# Patient Record
Sex: Male | Born: 1955 | Race: White | Hispanic: No | Marital: Married | Smoking: Never smoker
Health system: Southern US, Community
[De-identification: ages and names within clinical notes are randomized; demographics above are authoritative.]

## PROBLEM LIST (undated history)

## (undated) DIAGNOSIS — C61 Malignant neoplasm of prostate: Secondary | ICD-10-CM

## (undated) DIAGNOSIS — E7800 Pure hypercholesterolemia, unspecified: Secondary | ICD-10-CM

## (undated) DIAGNOSIS — T7840XA Allergy, unspecified, initial encounter: Secondary | ICD-10-CM

## (undated) DIAGNOSIS — K635 Polyp of colon: Secondary | ICD-10-CM

## (undated) HISTORY — DX: Polyp of colon: K63.5

## (undated) HISTORY — DX: Allergy, unspecified, initial encounter: T78.40XA

## (undated) HISTORY — DX: Malignant neoplasm of prostate: C61

---

## 2005-07-08 ENCOUNTER — Ambulatory Visit (HOSPITAL_COMMUNITY): Admission: RE | Admit: 2005-07-08 | Discharge: 2005-07-08 | Payer: Self-pay | Admitting: Infectious Diseases

## 2005-09-08 ENCOUNTER — Emergency Department (HOSPITAL_COMMUNITY): Admission: EM | Admit: 2005-09-08 | Discharge: 2005-09-08 | Payer: Self-pay | Admitting: Emergency Medicine

## 2006-12-03 ENCOUNTER — Emergency Department (HOSPITAL_COMMUNITY): Admission: EM | Admit: 2006-12-03 | Discharge: 2006-12-03 | Payer: Self-pay | Admitting: Emergency Medicine

## 2009-12-07 ENCOUNTER — Encounter (INDEPENDENT_AMBULATORY_CARE_PROVIDER_SITE_OTHER): Payer: Self-pay | Admitting: *Deleted

## 2010-10-21 ENCOUNTER — Emergency Department (HOSPITAL_BASED_OUTPATIENT_CLINIC_OR_DEPARTMENT_OTHER)
Admission: EM | Admit: 2010-10-21 | Discharge: 2010-10-21 | Payer: Self-pay | Source: Home / Self Care | Admitting: Emergency Medicine

## 2010-10-22 LAB — CSF CELL COUNT WITH DIFFERENTIAL
RBC Count, CSF: 0 /mm3
RBC Count, CSF: 0 /mm3
Tube #: 1
WBC, CSF: 0 /mm3 (ref 0–5)
WBC, CSF: 0 /mm3 (ref 0–5)

## 2010-10-22 LAB — BASIC METABOLIC PANEL
CO2: 27 mEq/L (ref 19–32)
Glucose, Bld: 87 mg/dL (ref 70–99)
Potassium: 4.1 mEq/L (ref 3.5–5.1)
Sodium: 144 mEq/L (ref 135–145)

## 2010-10-22 LAB — CBC
HCT: 41.5 % (ref 39.0–52.0)
Hemoglobin: 14.3 g/dL (ref 13.0–17.0)
MCV: 87.9 fL (ref 78.0–100.0)
RDW: 12.7 % (ref 11.5–15.5)
WBC: 6 10*3/uL (ref 4.0–10.5)

## 2010-10-22 LAB — GRAM STAIN

## 2010-10-22 LAB — DIFFERENTIAL
Basophils Absolute: 0.1 10*3/uL (ref 0.0–0.1)
Basophils Relative: 1 % (ref 0–1)
Lymphocytes Relative: 35 % (ref 12–46)
Lymphs Abs: 2.1 10*3/uL (ref 0.7–4.0)
Monocytes Absolute: 0.4 10*3/uL (ref 0.1–1.0)
Neutro Abs: 3.2 10*3/uL (ref 1.7–7.7)
Neutrophils Relative %: 53 % (ref 43–77)

## 2010-10-22 LAB — GLUCOSE, CSF: Glucose, CSF: 51 mg/dL (ref 43–76)

## 2010-10-24 ENCOUNTER — Encounter
Admission: RE | Admit: 2010-10-24 | Discharge: 2010-10-24 | Payer: Self-pay | Source: Home / Self Care | Attending: Family Medicine | Admitting: Family Medicine

## 2010-10-24 LAB — CSF CULTURE W GRAM STAIN
Culture: NO GROWTH
Gram Stain: NONE SEEN

## 2010-10-29 NOTE — Letter (Signed)
Summary: Referral - not able to see patient  [facility]  [address] [place], [place] [address]   Phone: [phone]  Fax: [phone]    December 07, 2009         Dr. Raechel Ache [address] [place], [place]  [address]            Karl [name] DOB:  10/17/1955 MRN:   [id number]    Dear Dr. Para March:  Thank you for your kind referral of the above patient.  We have attempted to schedule the recommended procedure, colonoscopy, but have not been able to schedule because:  _x_ The patient was not available by phone and/or has not returned our calls.  ___ The patient declined to schedule the procedure at this time.  We appreciate the referral and hope that we will have the opportunity to treat this patient in the future.    Sincerely,    [facility] [phone]

## 2012-07-04 ENCOUNTER — Emergency Department (HOSPITAL_BASED_OUTPATIENT_CLINIC_OR_DEPARTMENT_OTHER)
Admission: EM | Admit: 2012-07-04 | Discharge: 2012-07-04 | Disposition: A | Discharge status: Home / Self Care | Payer: Federal, State, Local not specified - PPO | Attending: Emergency Medicine | Admitting: Emergency Medicine

## 2012-07-04 ENCOUNTER — Encounter (HOSPITAL_BASED_OUTPATIENT_CLINIC_OR_DEPARTMENT_OTHER): Payer: Self-pay | Admitting: *Deleted

## 2012-07-04 DIAGNOSIS — M549 Dorsalgia, unspecified: Secondary | ICD-10-CM | POA: Insufficient documentation

## 2012-07-04 DIAGNOSIS — E7800 Pure hypercholesterolemia, unspecified: Secondary | ICD-10-CM | POA: Insufficient documentation

## 2012-07-04 HISTORY — DX: Pure hypercholesterolemia, unspecified: E78.00

## 2012-07-04 MED ORDER — CYCLOBENZAPRINE HCL 10 MG PO TABS: 10.0000 mg | ORAL_TABLET | Freq: Two times a day (BID) | ORAL | Status: DC | PRN

## 2012-07-04 MED ORDER — HYDROCODONE-ACETAMINOPHEN 5-325 MG PO TABS: 2.0000 | ORAL_TABLET | ORAL | Status: DC | PRN

## 2012-07-04 NOTE — ED Provider Notes (Signed)
Medical screening examination/treatment/procedure(s) were performed by non-physician practitioner and as supervising physician I was immediately available for consultation/collaboration.   Dazani Norby, MD 07/04/12 2331 

## 2012-07-04 NOTE — ED Notes (Signed)
Pt presents to ED today with  low back pain for the last month.  Pt reports no obvious injury or trauma.  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  Pt reports was working on deck and pain increased.

## 2012-07-04 NOTE — ED Notes (Signed)
Pt states he has had some low back pain for several months. Yesterday was working on a deck and ? Aggravated the issue.

## 2012-07-04 NOTE — ED Notes (Signed)
Called into pts room as pt very dissatisfied with service received today.  Attempted to explain to pt that unit census and acuity is high.  Pt states "well someone should have told me"  Advised pt that I did tell him early on that we were busy and I took responsibility for not coming back to speak with him and attempted to explain.  Pt was not happy with reasons but stated understanding.  Pt also extremely upset about treatment from PA.  Advised pt that I could not speak to treatment since I was not present but would relay dissatisfaction to DD.  Pt left dept calmed and improved.

## 2012-07-04 NOTE — ED Provider Notes (Signed)
History     CSN: [id number]  Arrival date & time 07/04/12  1409   First MD Initiated Contact with Patient 07/04/12 1616      Chief Complaint  Patient presents with  . Back Pain    (Consider location/radiation/quality/duration/timing/severity/associated sxs/prior treatment) Patient is a 56 y.o. male presenting with back pain. The history is provided by the patient.  Back Pain  This is a recurrent problem. The current episode started yesterday. The problem has not changed since onset.Associated with: yard work. The pain is present in the lumbar spine. The quality of the pain is described as aching. The pain is at a severity of 7/10. The pain is severe. The symptoms are aggravated by bending. He has tried nothing for the symptoms.  Pt reports he has pain in his back after working in yard. Pt reports he just wants a muscle relaxer and pain medication. Pt reports he will see his MD tomorrow Susann Givens)  Past Medical History  Diagnosis Date  . Hypercholesteremia     History reviewed. No pertinent past surgical history.  History reviewed. No pertinent family history.  History  Substance Use Topics  . Smoking status: Never Smoker   . Smokeless tobacco: Not on file  . Alcohol Use: No      Review of Systems  Musculoskeletal: Positive for back pain.  All other systems reviewed and are negative.    Allergies  Penicillins  Home Medications   Current Outpatient Rx  Name Route Sig Dispense Refill  . ASPIRIN 81 MG PO TABS Oral Take 81 mg by mouth daily.    Marland Kitchen EZETIMIBE-SIMVASTATIN 10-20 MG PO TABS Oral Take 1 tablet by mouth at bedtime.    . CYCLOBENZAPRINE HCL 10 MG PO TABS Oral Take 1 tablet (10 mg total) by mouth 2 (two) times daily as needed for muscle spasms. 20 tablet 0  . HYDROCODONE-ACETAMINOPHEN 5-325 MG PO TABS Oral Take 2 tablets by mouth every 4 (four) hours as needed for pain. 10 tablet 0    BP 146/74  Pulse 66  Temp 97.5 F (36.4 C) (Oral)  Resp 20  Ht 5\' 8"   (1.727 m)  Wt 150 lb (68.04 kg)  BMI 22.81 kg/m2  SpO2 100%  Physical Exam  Vitals reviewed. Constitutional: He appears well-developed and well-nourished.  HENT:  Head: Normocephalic.  Neck: Normal range of motion.  Cardiovascular: Normal rate and normal heart sounds.   Pulmonary/Chest: Effort normal.  Musculoskeletal: He exhibits tenderness.  Neurological: He is alert.  Skin: Skin is warm.    ED Course  Procedures (including critical care time)  Labs Reviewed - No data to display No results found.   1. Back pain       MDM  Pt very angry about waiting to be seen.   i apologized for delay and advised pt that this emergency dept is very busy and that staff has had unusually high acuity.        Lonia Skinner [place], [place] 07/04/12 1648  Lonia Skinner [place], [place] 07/04/12 [phone]

## 2013-08-09 ENCOUNTER — Other Ambulatory Visit: Payer: Self-pay | Admitting: Gastroenterology

## 2013-08-09 DIAGNOSIS — R634 Abnormal weight loss: Secondary | ICD-10-CM

## 2013-08-09 DIAGNOSIS — R1013 Epigastric pain: Secondary | ICD-10-CM

## 2013-08-17 ENCOUNTER — Ambulatory Visit
Admission: RE | Admit: 2013-08-17 | Discharge: 2013-08-17 | Disposition: A | Discharge status: Home / Self Care | Payer: Federal, State, Local not specified - PPO | Source: Ambulatory Visit | Attending: Gastroenterology | Admitting: Gastroenterology

## 2013-08-17 DIAGNOSIS — R1013 Epigastric pain: Secondary | ICD-10-CM

## 2013-08-17 DIAGNOSIS — R634 Abnormal weight loss: Secondary | ICD-10-CM

## 2013-08-17 MED ORDER — IOHEXOL 300 MG/ML  SOLN
100.0000 mL | Freq: Once | INTRAMUSCULAR | Status: AC | PRN
  Administered 2013-08-17: 100 mL via INTRAVENOUS

## 2014-06-02 ENCOUNTER — Encounter: Payer: Self-pay | Admitting: Cardiovascular Disease

## 2014-06-02 ENCOUNTER — Ambulatory Visit (INDEPENDENT_AMBULATORY_CARE_PROVIDER_SITE_OTHER): Payer: Federal, State, Local not specified - PPO | Admitting: Cardiovascular Disease

## 2014-06-02 VITALS — BP 130/86 | HR 73 | Ht 68.0 in | Wt 148.1 lb

## 2014-06-02 DIAGNOSIS — E785 Hyperlipidemia, unspecified: Secondary | ICD-10-CM

## 2014-06-02 DIAGNOSIS — R079 Chest pain, unspecified: Secondary | ICD-10-CM

## 2014-06-02 DIAGNOSIS — I208 Other forms of angina pectoris: Secondary | ICD-10-CM

## 2014-06-02 NOTE — Patient Instructions (Signed)
Your physician has requested that you have an exercise stress myoview. For further information please visit [email or web address]. Please follow instruction sheet, as given.   

## 2014-06-04 ENCOUNTER — Encounter: Payer: Self-pay | Admitting: Cardiovascular Disease

## 2014-06-04 DIAGNOSIS — I2089 Other forms of angina pectoris: Secondary | ICD-10-CM | POA: Insufficient documentation

## 2014-06-04 DIAGNOSIS — I208 Other forms of angina pectoris: Secondary | ICD-10-CM | POA: Insufficient documentation

## 2014-06-04 DIAGNOSIS — E785 Hyperlipidemia, unspecified: Secondary | ICD-10-CM | POA: Insufficient documentation

## 2014-06-04 NOTE — Progress Notes (Signed)
HPI:  Karl [name] is a 58 year-old man presenting for evaluation of chest pain. 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 Approximately one week ago, while at work, he had a 2 hour episode of pressure in the center of his chest. This ultimately resolved on it's own. There was no associated diaphoresis, dyspnea, or lightheadedness. An EKG was checked and showed anterior ST-T changes not previously seen. He has a long history of brief chest pain that occurs with emotional stress or anger, but has not previously had symptoms this long in duration. At the time of his most recent episode, he was fatigued but otherwise under no unusual stressor. He has remained physically active as he exercises regularly and he denies any exertional chest pain or pressure.   He is a lifelong nonsmoker. He has longstanding hyperlipidemia and has been on a statin drug for many years followed by his PCP. No hx of diabetes or HTN. Family hx includes CAD in his father (MI at 57, died of MI at 57). His mother dies at 74 of ovarian CA.  His only other significant medical history includes H Pylori infection diagnosed after he experienced freqent GI upset 2 years ago. He underwent extensive testing including EGD, colonoscopy, and CT scan. He has no hx of surgery.  Outpatient Encounter Prescriptions as of 06/02/2014  Medication Sig  . aspirin 81 MG tablet Take 81 mg by mouth daily.  Marland Kitchen atorvastatin (LIPITOR) 20 MG tablet Take 20 mg by mouth daily.  . Omega-3 Fatty Acids (OMEGA-3 FISH OIL) 1200 MG CAPS Take 1 capsule by mouth daily.  . [DISCONTINUED] clindamycin (CLEOCIN T) 1 % external solution   . [DISCONTINUED] cyclobenzaprine (FLEXERIL) 10 MG tablet Take 1 tablet (10 mg total) by mouth 2 (two) times daily as needed for muscle spasms.  . [DISCONTINUED] doxycycline (VIBRAMYCIN) 100 MG capsule   . [DISCONTINUED] ezetimibe-simvastatin (VYTORIN) 10-20 MG per tablet Take 1 tablet by mouth at bedtime.  .  [DISCONTINUED] HYDROcodone-acetaminophen (NORCO/VICODIN) 5-325 MG per tablet Take 2 tablets by mouth every 4 (four) hours as needed for pain.  . [DISCONTINUED] predniSONE (DELTASONE) 20 MG tablet     Penicillins  Past Medical History  Diagnosis Date  . Hypercholesteremia     History reviewed. No pertinent past surgical history.  History   Social History  . Marital Status: Married    Spouse Name: N/A    Number of Children: N/A  . Years of Education: N/A   Occupational History  . Not on file.   Social History Main Topics  . Smoking status: Never Smoker   . Smokeless tobacco: Not on file  . Alcohol Use: No  . Drug Use: No  . Sexual Activity: Not on file   Other Topics Concern  . Not on file   Social History Narrative  . No narrative on file    Family History  Problem Relation Age of Onset  . Heart attack Mother   . Cancer - Ovarian Father     ROS: General: no fevers/chills/night sweats Eyes: no blurry vision, diplopia, or amaurosis ENT: no sore throat or hearing loss Resp: no cough, wheezing, or hemoptysis CV: no edema or palpitations GI: no abdominal pain, nausea, vomiting, diarrhea, or constipation. Positive for abdominal bloating GU: no dysuria, frequency, or hematuria Skin: no rash Neuro: no headache, numbness, tingling, or weakness of extremities Musculoskeletal: no joint pain or swelling Heme: no bleeding, DVT, or easy bruising Endo: no  polydipsia or polyuria  BP 130/86  Pulse 73  Ht 5\' 8"  (1.727 m)  Wt 148 lb 1.9 oz (67.187 kg)  BMI 22.53 kg/m2  PHYSICAL EXAM: Pt is alert and oriented, WD, WN, in no distress. HEENT: normal Neck: JVP normal. Carotid upstrokes normal without bruits. No thyromegaly. Lungs: equal expansion, clear bilaterally CV: Apex is discrete and nondisplaced, RRR without murmur or gallop Abd: soft, NT, +BS, no bruit, no hepatosplenomegaly Back: no CVA tenderness Ext: no C/C/E        DP/PT pulses intact and = Skin: warm and  dry without rash Neuro: CNII-XII intact             Strength intact = bilaterally  EKG:  NSR 73 bpm, ST-T wave abnormality consider anterior ischemia  ASSESSMENT AND PLAN: Chest pain with abnormal EKG. EKG reviewed and shows symmetric anterior T wave inversion concerning for ischemic heart disease. His recent episode of chest pain was prolonged with typical pressure-like sensation in the substernal region. This scenario clearly warrants stress testing in a 58 year-old male with family hx of CAD and longstanding hyperlipidemia. I have recommended an exercise Myoview study to evaluate for ischemic heart disease. Will determine further evaluation and follow-up depending on stress test findings. Current meds reviewed and appropriate with ASA and a statin drug.  Sherren Mocha MD 06/04/2014 9:05 AM

## 2014-06-07 ENCOUNTER — Ambulatory Visit (HOSPITAL_COMMUNITY): Payer: Federal, State, Local not specified - PPO | Attending: Cardiovascular Disease | Admitting: Radiology

## 2014-06-07 VITALS — BP 169/85 | HR 66 | Ht 68.0 in | Wt 147.0 lb

## 2014-06-07 DIAGNOSIS — R079 Chest pain, unspecified: Secondary | ICD-10-CM | POA: Insufficient documentation

## 2014-06-07 DIAGNOSIS — E785 Hyperlipidemia, unspecified: Secondary | ICD-10-CM | POA: Diagnosis not present

## 2014-06-07 MED ORDER — TECHNETIUM TC 99M SESTAMIBI GENERIC - CARDIOLITE
30.0000 | Freq: Once | INTRAVENOUS | Status: AC | PRN
  Administered 2014-06-07: 30 via INTRAVENOUS

## 2014-06-07 MED ORDER — TECHNETIUM TC 99M SESTAMIBI GENERIC - CARDIOLITE
10.0000 | Freq: Once | INTRAVENOUS | Status: AC | PRN
  Administered 2014-06-07: 10 via INTRAVENOUS

## 2014-06-07 NOTE — Progress Notes (Signed)
[facility] [place], [place] [address] [phone]    Cardiology Nuclear Med Study  Karl [name] is a 58 y.o. male     MRN : [id number]     DOB: 04/19/1956  Procedure Date: 06/07/2014  Nuclear Med Background Indication for Stress Test:  Evaluation for Ischemia and Abnormal EKG History:  No known CAD Cardiac Risk Factors: Lipids  Symptoms:  Chest Pain (last date of chest discomfort was ten days ago)   Nuclear Pre-Procedure Caffeine/Decaff Intake:  None> 12 hrs NPO After: 7:00pm   Lungs:  clear O2 Sat: 98% on room air. IV 0.9% NS with Angio Cath:  22g  IV Site: R Wrist x 1, tolerated well IV Started by:  Irven Baltimore, RN  Chest Size (in):  40 Cup Size: n/a  Height: 5\' 8"  (1.727 m)  Weight:  147 lb (66.679 kg)  BMI:  Body mass index is 22.36 kg/(m^2). Tech Comments:  N/A    Nuclear Med Study 1 or 2 day study: 1 day  Stress Test Type:  Stress  Reading MD: N/A  Order Authorizing Provider:  Sherren Mocha, MD  Resting Radionuclide: Technetium 24m Sestamibi  Resting Radionuclide Dose: 10.9 mCi   Stress Radionuclide:  Technetium 4m Sestamibi  Stress Radionuclide Dose: 33.0 mCi           Stress Protocol Rest HR: 66 Stress HR: 137  Rest BP: 169/85 Stress BP: 217/69  Exercise Time (min): 11:00 METS: 13.1           Dose of Adenosine (mg):  n/a Dose of Lexiscan: n/a mg  Dose of Atropine (mg): n/a Dose of Dobutamine: n/a mcg/kg/min (at max HR)  Stress Test Technologist: Glade Lloyd, BS-ES  Nuclear Technologist:  Annye Rusk, CNMT     Rest Procedure:  Myocardial perfusion imaging was performed at rest 45 minutes following the intravenous administration of Technetium 41m Sestamibi. Rest ECG: NSR - Normal EKG  Stress Procedure:  The patient exercised on the treadmill utilizing the Bruce Protocol for 11:00 minutes. The patient stopped due to fatigue and denied any chest pain.  Technetium 30m Sestamibi was injected at peak exercise  and myocardial perfusion imaging was performed after a brief delay. Stress ECG: Significant ST abnormalities consistent with ischemia. Horizontal 1 mm St depression in V5-V6 and in the inferior leads  QPS Raw Data Images:  Normal; no motion artifact; normal heart/lung ratio. Stress Images:  Normal homogeneous uptake in all areas of the myocardium. Rest Images:  Normal homogeneous uptake in all areas of the myocardium. Subtraction (SDS):  No evidence of ischemia. Transient Ischemic Dilatation (Normal <1.22):  1.06 Lung/Heart Ratio (Normal <0.45):  0.29  Quantitative Gated Spect Images QGS EDV:  105 ml QGS ESV:  44 ml  Impression Exercise Capacity:  Good exercise capacity. BP Response:  Hypertensive blood pressure response. Clinical Symptoms:  No significant symptoms noted. ECG Impression:  Significant ST abnormalities consistent with ischemia. Comparison with Prior Nuclear Study: No previous nuclear study performed  Overall Impression:  Normal stress nuclear study. Suspect "false positive" ECG response.  LV Ejection Fraction: 58%.  LV Wall Motion:  NL LV Function; NL Wall Motion   Sanda Klein, MD, [facility] [phone] office [phone] pager

## 2015-05-01 DIAGNOSIS — Z8546 Personal history of malignant neoplasm of prostate: Secondary | ICD-10-CM | POA: Insufficient documentation

## 2015-05-24 DIAGNOSIS — N481 Balanitis: Secondary | ICD-10-CM | POA: Insufficient documentation

## 2015-05-24 DIAGNOSIS — N5231 Erectile dysfunction following radical prostatectomy: Secondary | ICD-10-CM | POA: Insufficient documentation

## 2016-01-08 MED FILL — ATORVASTATIN 20 MG TABLET: 20 | 90 days supply | Qty: 90 | Fill #0

## 2016-02-06 MED FILL — EZETIMIBE-SIMVASTATIN 10-20: 10-20 | 90 days supply | Qty: 90 | Fill #0

## 2016-04-29 DIAGNOSIS — C61 Malignant neoplasm of prostate: Secondary | ICD-10-CM

## 2016-04-29 HISTORY — DX: Malignant neoplasm of prostate: C61

## 2016-04-29 HISTORY — PX: PROSTATECTOMY: SHX69

## 2016-05-08 MED FILL — EZETIMIBE-SIMVASTATIN 10-20: 10-20 | 90 days supply | Qty: 90 | Fill #1

## 2016-07-10 MED FILL — predniSONE 10 MG (48) TBPK: 10 | 12 days supply | Qty: 48 | Fill #0

## 2016-07-10 MED FILL — FLUTICASONE PROP 50 MCG SPR: 50 | 30 days supply | Qty: 16 | Fill #0

## 2016-08-20 MED FILL — EZETIMIBE-SIMVASTATIN 10-20: 10-20 | 90 days supply | Qty: 90 | Fill #2

## 2016-08-20 MED FILL — FLUTICASONE PROP 50 MCG SPR: 50 | 30 days supply | Qty: 16 | Fill #1

## 2016-11-18 MED FILL — EZETIMIBE-SIMVASTATIN 10-20: 10-20 | 90 days supply | Qty: 90 | Fill #3

## 2016-11-27 MED FILL — FLUTICASONE PROP 50 MCG SPR: 50 | 30 days supply | Qty: 16 | Fill #2

## 2017-02-05 DIAGNOSIS — J3489 Other specified disorders of nose and nasal sinuses: Secondary | ICD-10-CM | POA: Insufficient documentation

## 2017-03-09 MED FILL — EZETIMIBE-SIMVASTATIN 10-20: 10-20 | 90 days supply | Qty: 90 | Fill #0

## 2017-04-08 MED FILL — FLUTICASONE PROP 50 MCG SPR: 50 | 30 days supply | Qty: 16 | Fill #3

## 2017-05-29 MED FILL — EZETIMIBE-SIMVASTATIN 10-20: 10-20 | 90 days supply | Qty: 90 | Fill #0

## 2017-08-07 ENCOUNTER — Other Ambulatory Visit: Payer: Self-pay

## 2017-08-07 DIAGNOSIS — E875 Hyperkalemia: Secondary | ICD-10-CM

## 2017-08-24 ENCOUNTER — Telehealth: Payer: Self-pay

## 2017-08-24 ENCOUNTER — Other Ambulatory Visit: Payer: Federal, State, Local not specified - PPO | Admitting: *Deleted

## 2017-08-24 DIAGNOSIS — E875 Hyperkalemia: Secondary | ICD-10-CM

## 2017-08-24 NOTE — Telephone Encounter (Signed)
Called and made patient aware that Dr. Posey Pronto (referred to nephrology for hyperkalemia) is requesting that we draw serum potassium, plasma potassium, an CBC w/ diff prior to him seeing him. Patient verbalized understanding and is coming in today to have labs drawn. I have also requested (for Dr. Posey Pronto) patients last 3 sets of labs from his PCP at [facility]. They states that they will fax them over.

## 2017-08-25 LAB — CBC WITH DIFFERENTIAL/PLATELET
BASOS ABS: 0.1 10*3/uL (ref 0.0–0.2)
Basos: 1 %
EOS (ABSOLUTE): 0.5 10*3/uL — ABNORMAL HIGH (ref 0.0–0.4)
Eos: 8 %
HEMATOCRIT: 40.2 % (ref 37.5–51.0)
Hemoglobin: 13.8 g/dL (ref 13.0–17.7)
Immature Grans (Abs): 0 10*3/uL (ref 0.0–0.1)
Immature Granulocytes: 0 %
LYMPHS ABS: 2.2 10*3/uL (ref 0.7–3.1)
Lymphs: 37 %
MCH: 31.2 pg (ref 26.6–33.0)
MCHC: 34.3 g/dL (ref 31.5–35.7)
MCV: 91 fL (ref 79–97)
MONOS ABS: 0.4 10*3/uL (ref 0.1–0.9)
Monocytes: 7 %
Neutrophils Absolute: 2.8 10*3/uL (ref 1.4–7.0)
Neutrophils: 47 %
Platelets: 261 10*3/uL (ref 150–379)
RBC: 4.42 x10E6/uL (ref 4.14–5.80)
RDW: 13.6 % (ref 12.3–15.4)
WBC: 6 10*3/uL (ref 3.4–10.8)

## 2017-08-25 LAB — POTASSIUM, HEPARIN PLASMA: POTASSIUM, HEPARIN PLASMA: 4.4 mmol/L (ref 3.5–5.2)

## 2017-08-25 LAB — POTASSIUM: POTASSIUM: 5.1 mmol/L (ref 3.5–5.2)

## 2017-08-26 NOTE — Telephone Encounter (Signed)
Lab results received from patient's PCP. Faxed those lab results as well as lab results from our office 11/26 to Dr. Serita Grit office. Confirmation received that fax was successful.

## 2017-09-14 ENCOUNTER — Encounter: Payer: Self-pay | Admitting: Gastroenterology

## 2017-09-16 MED FILL — EZETIMIBE-SIMVASTATIN 10-20: 10-20 | 90 days supply | Qty: 90 | Fill #1

## 2017-11-03 ENCOUNTER — Ambulatory Visit: Payer: Federal, State, Local not specified - PPO | Admitting: Gastroenterology

## 2017-12-09 ENCOUNTER — Ambulatory Visit: Payer: Federal, State, Local not specified - PPO | Admitting: Gastroenterology

## 2017-12-09 ENCOUNTER — Other Ambulatory Visit (INDEPENDENT_AMBULATORY_CARE_PROVIDER_SITE_OTHER): Payer: Federal, State, Local not specified - PPO

## 2017-12-09 ENCOUNTER — Encounter: Payer: Self-pay | Admitting: Gastroenterology

## 2017-12-09 VITALS — BP 120/78 | HR 74 | Ht 65.0 in | Wt 154.8 lb

## 2017-12-09 DIAGNOSIS — R140 Abdominal distension (gaseous): Secondary | ICD-10-CM | POA: Diagnosis not present

## 2017-12-09 DIAGNOSIS — K921 Melena: Secondary | ICD-10-CM

## 2017-12-09 DIAGNOSIS — R197 Diarrhea, unspecified: Secondary | ICD-10-CM | POA: Diagnosis not present

## 2017-12-09 DIAGNOSIS — R194 Change in bowel habit: Secondary | ICD-10-CM

## 2017-12-09 LAB — HEMOGLOBIN: Hemoglobin: 15.5 g/dL (ref 13.0–17.0)

## 2017-12-09 LAB — HEMATOCRIT: HCT: 46.1 % (ref 39.0–52.0)

## 2017-12-09 NOTE — Progress Notes (Addendum)
Karl [name]    [id number]    1956/01/23  Primary Care Physician:Wong, Edwyna Shell, MD  Referring Physician: No referring provider defined for this encounter.  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  He works in the [employer] at [facility] [employer] [employer].  He started noticing change in bowel habits the past 1-2 years, some days he has multiple bowel movements one after the other, about 2-3 times to completely evacuate.  On some days he has no bowel movement or has one later in the.  He is a vegetarian and does not drink milk and eats cheese.  He was previously followed by Dr. Cristina Gong.  He is status post treatment for H. pylori gastritis.  History of colon polyps on colonoscopy in 2005 and 2010 follow-up colonoscopy in 2015 with no polyps per patient, report is not available during this visit to review.  Had an EGD in 2015 and H. pylori stool antigen after treatment confirmed eradication Denies any nausea, vomiting, abdominal pain,or bright red blood per rectum.  He reports noticing black tarry stools occasionally once or twice in the past few months.  No heartburn or odynophagia.      Outpatient Encounter Medications as of 12/09/2017  Medication Sig  . aspirin 81 MG tablet Take 81 mg by mouth daily.  Marland Kitchen ezetimibe-simvastatin (VYTORIN) 10-20 MG tablet Take by mouth daily.  . Omega-3 Fatty Acids (OMEGA-3 FISH OIL) 1200 MG CAPS Take 1 capsule by mouth daily.  . [DISCONTINUED] atorvastatin (LIPITOR) 20 MG tablet Take 20 mg by mouth daily.   No facility-administered encounter medications on file as of 12/09/2017.     Allergies as of 12/09/2017 - Review Complete 12/09/2017  Allergen Reaction Noted  . Penicillins Rash 07/04/2012    Past Medical History:  Diagnosis Date  . Colon polyps   . Hypercholesteremia   . Prostate cancer  ([place]) 04/2016    Past Surgical History:  Procedure Laterality Date  . PROSTATECTOMY  04/2016    Family History  Problem Relation Age of Onset  . Heart attack Mother     Social History   Socioeconomic History  . Marital status: Married    Spouse name: Not on file  . Number of children: 0  . Years of education: Not on file  . Highest education level: Not on file  Social Needs  . Financial resource strain: Not on file  . Food insecurity - worry: Not on file  . Food insecurity - inability: Not on file  . Transportation needs - medical: Not on file  . Transportation needs - non-medical: Not on file  Occupational History  . Occupation: Therapist, sports  Tobacco Use  . Smoking status: Never Smoker  . Smokeless tobacco: Never Used  Substance and Sexual Activity  . Alcohol use: No  . Drug use: No  . Sexual activity: Not on file  Other Topics Concern  . Not on file  Social History Narrative  . Not on file      Review of systems: Review of Systems  Constitutional: Negative for fever and chills.  HENT: Negative.   Eyes: Negative for blurred vision.  Respiratory: Negative for cough, shortness of breath and wheezing.   Cardiovascular: Negative for chest pain and palpitations.  Gastrointestinal: as per HPI Genitourinary: Negative for dysuria, urgency, frequency and hematuria.  Musculoskeletal: Negative for myalgias, back pain and joint pain.  Skin: Negative for itching and rash.  Neurological: Negative for dizziness, tremors, focal weakness, seizures and loss of consciousness.  Endo/Heme/Allergies: Negative for seasonal allergies.  Psychiatric/Behavioral: Negative for depression, suicidal ideas and hallucinations.  All other systems reviewed and are negative.   Physical Exam: Vitals:   12/09/17 0928  BP: 120/78  Pulse: 74   Body mass index is 25.76 kg/m. Gen:      No acute distress HEENT:  EOMI, sclera anicteric Neck:     No masses; no thyromegaly Lungs:    Clear to  auscultation bilaterally; normal respiratory effort CV:         Regular rate and rhythm; no murmurs Abd:      + bowel sounds; soft, non-tender; no palpable masses, no distension Ext:    No edema; adequate peripheral perfusion Skin:      Warm and dry; no rash Neuro: alert and oriented x 3 Psych: normal mood and affect  Data Reviewed:  Reviewed labs, radiology imaging, old records and pertinent past GI work up   Assessment and Plan/Recommendations:  62 year old male with history of prostate cancer status post resection here with complaints of change in bowel habits intermittent diarrhea.  He also has intermittent abdominal bloating and black tarry stools. Will check hemoglobin and hematocrit to see if he has any drop Advised patient to call if he has any further episodes of black tarry stool, will consider further evaluation.  We will start with fecal Hemoccult stool cards.  Will request records of prior EGD and colonoscopies done at [place] by Dr. Cristina Gong Trial of lactose-free diet for 2 weeks as dairy products may be playing a role with increased bowel frequency and bloating Increase fluid intake to 10-12 cups of water daily, decrease caffeinated drinks Trial of water-soluble fiber, Benefiber 1 tablespoon 2-3 times daily with meals Return as needed   K. Denzil Magnuson , MD [phone] Mon-Fri 8a-5p [phone] after 5p, weekends, holidays  CC: No ref. provider found  Addendum: Received records from [facility] Colonoscopy April 20, 2015: Surveillance due to history of colon polyps Exam was normal, recommended repeat colonoscopy in 5 years for surveillance  EGD July 21, 2013 Multiple small gastric antral erosions, were biopsied.  Esophagus and duodenum appeared normal. 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

## 2017-12-09 NOTE — Patient Instructions (Signed)
Your physician has requested that you go to the basement for   lab work before leaving today:  Increase fiber intake daily. Start Benifiber 2-3 tablespoons 3 times daily with meals.Lactose-Free Diet, Adult If you have lactose intolerance, you are not able to digest lactose. Lactose is a natural sugar found mainly in milk and milk products. You may need to avoid all foods and beverages that contain lactose. A lactose-free diet can help you do this. What do I need to know about this diet?  Do not consume foods, beverages, vitamins, minerals, or medicines with lactose. Read ingredients lists carefully.  Look for the words "lactose-free" on labels.  Use lactase enzyme drops or tablets as directed by your health care provider.  Use lactose-free milk or a milk alternative, such as soy milk, for drinking and cooking.  Make sure you get enough calcium and vitamin D in your diet. A lactose-free eating plan can be lacking in these important nutrients.  Take calcium and vitamin D supplements as directed by your health care provider. Talk to your provider about supplements if you are not able to get enough calcium and vitamin D from food. Which foods have lactose? Lactose is found in:  Milk and foods made from milk.  Yogurt.  Cheese.  Butter.  Margarine.  Sour cream.  Cream.  Whipped toppings and nondairy creamers.  Ice cream and other milk-based desserts.  Lactose is also found in foods or products made with milk or milk ingredients. To find out whether a food contains milk or a milk ingredient, look at the ingredients list. Avoid foods with the statement "May contain milk" and foods that contain:  Butter.  Cream.  Milk.  Milk solids.  Milk powder.  Whey.  Curd.  Caseinate.  Lactose.  Lactalbumin.  Lactoglobulin.  What are some alternatives to milk and foods made with milk products?  Lactose-free milk.  Soy milk with added calcium and vitamin D.  Almond,  coconut, or rice milk with added calcium and vitamin D. Note that these are low in protein.  Soy products, such as soy yogurt, soy cheese, soy ice cream, and soy-based sour cream. Which foods can I eat? Grains Breads and rolls made without milk, such as [place], [place], or [place] bread, bagels, pita, and [employer]. Corn tortillas, corn meal, grits, and polenta. Crackers without lactose or milk solids, such as soda crackers and graham crackers. Cooked or dry cereals without lactose or milk solids. Pasta, quinoa, couscous, barley, oats, bulgur, farro, rice, wild rice, or other grains prepared without milk or lactose. Plain popcorn. Vegetables Fresh, frozen, and canned vegetables without cheese, cream, or butter sauces. Fruits All fresh, canned, frozen, or dried fruits that are not processed with lactose. Meats and Other Protein Sources Plain beef, chicken, fish, [place], lamb, veal, pork, wild game, or ham. Kosher-prepared meat products. Strained or junior meats that do not contain milk. Eggs. Soy meat substitutes. Beans, lentils, and hummus. Tofu. Nuts and seeds. Peanut or other nut butters without lactose. Soups, casseroles, and mixed dishes without cheese, cream, or milk. Dairy Lactose-free milk. Soy, rice, or almond milk with added calcium and vitamin D. Soy cheese and yogurt. Beverages Carbonated drinks. Tea. Coffee, freeze-dried coffee, and some instant coffees. Fruit and vegetable juices. Condiments Soy sauce. Carob powder. Olives. Gravy made with water. Baker's cocoa. Angie Fava. Pure seasonings and spices. Ketchup. Mustard. Bouillon. Broth. Sweets and Desserts Water and fruit ices. Gelatin. Cookies, pies, or cakes made from allowed ingredients, such as angel food cake.  Pudding made with water or a milk substitute. Lactose-free tofu desserts. Soy, coconut milk, or rice-milk-based frozen desserts. Sugar. Honey. Jam, jelly, and marmalade. Molasses. Pure sugar candy. Dark chocolate without milk.  Marshmallows. Fats and Oils Margarines and salad dressings that do not contain milk. Berniece Salines. Vegetable oils. Shortening. Mayonnaise. Soy or coconut-based cream. The items listed above may not be a complete list of recommended foods or beverages. Contact your dietitian for more options. Which foods are not recommended? Grains Breads and rolls that contain milk. Toaster pastries. Muffins, biscuits, waffles, cornbread, and pancakes. These can be prepared at home, commercial, or from mixes. Sweet rolls, donuts, English muffins, fry bread, lefse, flour tortillas with lactose, or [place] toast made with milk or milk ingredients. Crackers that contain lactose. Corn curls. Cooked or dry cereals with lactose. Vegetables Creamed or breaded vegetables. Vegetables in a cheese or butter sauce or with lactose-containing margarines. Instant potatoes. [place] fries. Scalloped or au gratin potatoes. Fruits None. Meats and Other Protein Sources Scrambled eggs, omelets, and souffles that contain milk. Creamed or breaded meat, fish, chicken, or [place]. Sausage products, such as wieners and liver sausage. Cold cuts that contain milk solids. Cheese, cottage cheese, ricotta cheese, and cheese spreads. Lasagna and macaroni and cheese. Pizza. Peanut or other nut butters with added milk solids. Casseroles or mixed dishes containing milk or cheese. Dairy All dairy products, including milk, goat's milk, buttermilk, kefir, acidophilus milk, flavored milk, evaporated milk, condensed milk, dulce de [place], eggnog, yogurt, cheese, and cheese spreads. Beverages Hot chocolate. Cocoa with lactose. Instant iced teas. Powdered fruit drinks. Smoothies made with milk or yogurt. Condiments Chewing gum that has lactose. Cocoa that has lactose. Spice blends if they contain milk products. Artificial sweeteners that contain lactose. Nondairy creamers. Sweets and Desserts Ice cream, ice milk, gelato, sherbet, and frozen yogurt. Custard,  pudding, and mousse. Cake, cream pies, cookies, and other desserts containing milk, cream, cream cheese, or milk chocolate. Pie crust made with milk-containing margarine or butter. Reduced-calorie desserts made with a sugar substitute that contains lactose. Toffee and butterscotch. Milk, white, or dark chocolate that contains milk. Fudge. Caramel. Fats and Oils Margarines and salad dressings that contain milk or cheese. Cream. Half and half. Cream cheese. Sour cream. Chip dips made with sour cream or yogurt. The items listed above may not be a complete list of foods and beverages to avoid. Contact your dietitian for more information. Am I getting enough calcium? Calcium is found in many foods that contain lactose and is important for bone health. The amount of calcium you need depends on your age:  Adults younger than 50 years: 1000 mg of calcium a day.  Adults older than 50 years: 1200 mg of calcium a day.  If you are not getting enough calcium, other calcium sources include:  Orange juice with calcium added. There are 300-350 mg of calcium in 1 cup of orange juice.  Sardines with edible bones. There are 325 mg of calcium in 3 oz of sardines.  Calcium-fortified soy milk. There are 300-400 mg of calcium in 1 cup of calcium-fortified soy milk.  Calcium-fortified rice or almond milk. There are 300 mg of calcium in 1 cup of calcium-fortified rice or almond milk.  Canned salmon with edible bones. There are 180 mg of calcium in 3 oz of canned salmon with edible bones.  Calcium-fortified breakfast cereals. There are [phone] mg of calcium in calcium-fortified breakfast cereals.  Tofu set with calcium sulfate. There are 250 mg of calcium in  cup of tofu set with calcium sulfate.  Spinach, cooked. There are 145 mg of calcium in  cup of cooked spinach.  Edamame, cooked. There are 130 mg of calcium in  cup of cooked edamame.  Collard greens, cooked. There are 125 mg of calcium in  cup of  cooked collard greens.  Kale, frozen or cooked. There are 90 mg of calcium in  cup of cooked or frozen kale.  Almonds. There are 95 mg of calcium in  cup of almonds.  Broccoli, cooked. There are 60 mg of calcium in 1 cup of cooked broccoli.  This information is not intended to replace advice given to you by your health care provider. Make sure you discuss any questions you have with your health care provider. Document Released: 03/07/2002 Document Revised: 02/21/2016 Document Reviewed: 12/16/2013 Elsevier Interactive Patient Education  [employer].  If you are age 63 or older, your body mass index should be between 23-30. Your Body mass index is 25.76 kg/m. If this is out of the aforementioned range listed, please consider follow up with your Primary Care Provider.  If you are age 41 or younger, your body mass index should be between 19-25. Your Body mass index is 25.76 kg/m. If this is out of the aformentioned range listed, please consider follow up with your Primary Care Provider.

## 2017-12-18 MED FILL — EZETIMIBE-SIMVASTATIN 10-20: 10-20 | 90 days supply | Qty: 90 | Fill #2

## 2018-03-29 MED FILL — EZETIMIBE-SIMVASTATIN 10-20: 10-20 | 30 days supply | Qty: 30 | Fill #0

## 2018-04-26 MED FILL — EZETIMIBE-SIMVASTATIN 10-40: 10-40 | 90 days supply | Qty: 90 | Fill #0

## 2018-07-14 MED FILL — EZETIMIBE-SIMVASTATIN 10-40: 10-40 | 90 days supply | Qty: 90 | Fill #1

## 2018-11-12 MED FILL — EZETIMIBE-SIMVASTATIN 10-40: 10-40 | 90 days supply | Qty: 90 | Fill #2

## 2019-02-04 ENCOUNTER — Encounter: Payer: Self-pay | Admitting: Gastroenterology

## 2019-02-09 MED FILL — EZETIMIBE-SIMVAS 10-40 MG T: 10-40 | 90 days supply | Qty: 90 | Fill #0

## 2019-04-15 ENCOUNTER — Encounter: Payer: Self-pay | Admitting: Gastroenterology

## 2019-04-15 ENCOUNTER — Other Ambulatory Visit: Payer: Self-pay

## 2019-04-15 ENCOUNTER — Ambulatory Visit (INDEPENDENT_AMBULATORY_CARE_PROVIDER_SITE_OTHER): Payer: Federal, State, Local not specified - PPO | Admitting: Gastroenterology

## 2019-04-15 ENCOUNTER — Other Ambulatory Visit: Payer: Self-pay | Admitting: *Deleted

## 2019-04-15 VITALS — Ht 67.0 in | Wt 145.0 lb

## 2019-04-15 DIAGNOSIS — Z86010 Personal history of colonic polyps: Secondary | ICD-10-CM | POA: Diagnosis not present

## 2019-04-15 DIAGNOSIS — K2970 Gastritis, unspecified, without bleeding: Secondary | ICD-10-CM

## 2019-04-15 DIAGNOSIS — K580 Irritable bowel syndrome with diarrhea: Secondary | ICD-10-CM

## 2019-04-15 DIAGNOSIS — B9681 Helicobacter pylori [H. pylori] as the cause of diseases classified elsewhere: Secondary | ICD-10-CM

## 2019-04-15 DIAGNOSIS — E739 Lactose intolerance, unspecified: Secondary | ICD-10-CM | POA: Diagnosis not present

## 2019-04-15 NOTE — Patient Instructions (Addendum)
Trial of lactose-free diet for 1 to 2 weeks  Benefiber 1 teaspoon 3 times daily with meals  Advised patient to send an update through my chart in 2 weeks if any improvement of bowel habits  Recall colonoscopy July 2021   Lactose-Free Diet, Adult If you have lactose intolerance, you are not able to digest lactose. Lactose is a natural sugar found mainly in dairy milk and dairy products. You may need to avoid all foods and beverages that contain lactose. A lactose-free diet can help you do this. Which foods have lactose? Lactose is found in dairy milk and dairy products, such as:  Yogurt.  Cheese.  Butter.  Margarine.  Sour cream.  Cream.  Whipped toppings and nondairy creamers.  Ice cream and other dairy-based desserts. Lactose is also found in foods or products made with dairy milk or milk ingredients. To find out whether a food contains dairy milk or a milk ingredient, look at the ingredients list. Avoid foods with the statement "May contain milk" and foods that contain:  Milk powder.  Whey.  Curd.  Caseinate.  Lactose.  Lactalbumin.  Lactoglobulin. What are alternatives to dairy milk and foods made with milk products?  Lactose-free milk.  Soy milk with added calcium and vitamin D.  Almond milk, coconut milk, rice milk, or other nondairy milk alternatives with added calcium and vitamin D. Note that these are low in protein.  Soy products, such as soy yogurt, soy cheese, soy ice cream, and soy-based sour cream.  Other nut milk products, such as almond yogurt, almond cheese, cashew yogurt, cashew cheese, cashew ice cream, coconut yogurt, and coconut ice cream. What are tips for following this plan?  Do not consume foods, beverages, vitamins, minerals, or medicines containing lactose. Read ingredient lists carefully.  Look for the words "lactose-free" on labels.  Use lactase enzyme drops or tablets as directed by your health care provider.  Use  lactose-free milk or a milk alternative, such as soy milk or almond milk, for drinking and cooking.  Make sure you get enough calcium and vitamin D in your diet. A lactose-free eating plan can be lacking in these important nutrients.  Take calcium and vitamin D supplements as directed by your health care provider. Talk to your health care provider about supplements if you are not able to get enough calcium and vitamin D from food. What foods can I eat?  Fruits All fresh, canned, frozen, or dried fruits that are not processed with lactose. Vegetables All fresh, frozen, and canned vegetables without cheese, cream, or butter sauces. Grains Any that are not made with dairy milk or dairy products. Meats and other proteins Any meat, fish, poultry, and other protein sources that are not made with dairy milk or dairy products. Soy cheese and yogurt. Fats and oils Any that are not made with dairy milk or dairy products. Beverages Lactose-free milk. Soy, rice, or almond milk with added calcium and vitamin D. Fruit and vegetable juices. Sweets and desserts Any that are not made with dairy milk or dairy products. Seasonings and condiments Any that are not made with dairy milk or dairy products. Calcium Calcium is found in many foods that contain lactose and is important for bone health. The amount of calcium you need depends on your age:  Adults younger than 50 years: 1,000 mg of calcium a day.  Adults older than 50 years: 1,200 mg of calcium a day. If you are not getting enough calcium, you may get it from  other sources, including:  Orange juice with calcium added. There are 300-350 mg of calcium in 1 cup of orange juice.  Calcium-fortified soy milk. There are 300-400 mg of calcium in 1 cup of calcium-fortified soy milk.  Calcium-fortified rice or almond milk. There are 300 mg of calcium in 1 cup of calcium-fortified rice or almond milk.  Calcium-fortified breakfast cereals. There are  100-1,000 mg of calcium in calcium-fortified breakfast cereals.  Spinach, cooked. There are 145 mg of calcium in  cup of cooked spinach.  Edamame, cooked. There are 130 mg of calcium in  cup of cooked edamame.  Collard greens, cooked. There are 125 mg of calcium in  cup of cooked collard greens.  Kale, frozen or cooked. There are 90 mg of calcium in  cup of cooked or frozen kale.  Almonds. There are 95 mg of calcium in  cup of almonds.  Broccoli, cooked. There are 60 mg of calcium in 1 cup of cooked broccoli. The items listed above may not be a complete list of recommended foods and beverages. Contact a dietitian for more options. What foods are not recommended? Fruits None, unless they are made with dairy milk or dairy products. Vegetables None, unless they are made with dairy milk or dairy products. Grains Any grains that are made with dairy milk or dairy products. Meats and other proteins None, unless they are made with dairy milk or dairy products. Dairy All dairy products, including milk, goat's milk, buttermilk, kefir, acidophilus milk, flavored milk, evaporated milk, condensed milk, dulce de [place], eggnog, yogurt, cheese, and cheese spreads. Fats and oils Any that are made with milk or milk products. Margarines and salad dressings that contain milk or cheese. Cream. Half and half. Cream cheese. Sour cream. Chip dips made with sour cream or yogurt. Beverages Hot chocolate. Cocoa with lactose. Instant iced teas. Powdered fruit drinks. Smoothies made with dairy milk or yogurt. Sweets and desserts Any that are made with milk or milk products. Seasonings and condiments Chewing gum that has lactose. Spice blends if they contain lactose. Artificial sweeteners that contain lactose. Nondairy creamers. The items listed above may not be a complete list of foods and beverages to avoid. Contact a dietitian for more information. Summary  If you are lactose intolerant, it means that  you have a hard time digesting lactose, a natural sugar found in milk and milk products.  Following a lactose-free diet can help you manage this condition.  Calcium is important for bone health and is found in many foods that contain lactose. Talk with your health care provider about other sources of calcium. This information is not intended to replace advice given to you by your health care provider. Make sure you discuss any questions you have with your health care provider. Document Released: 03/07/2002 Document Revised: 10/13/2017 Document Reviewed: 10/13/2017 Elsevier Patient Education  [employer].  I appreciate the  opportunity to care for you  Thank You   Harl Bowie , MD

## 2019-04-15 NOTE — Progress Notes (Signed)
[name]    [id number]    Oct 04, 1955  Primary Care Physician:Wong, Edwyna Shell, MD  Referring Physician: Vernie Shanks, MD [address] [place],  [place] [address]  This service was provided via  telemedicine due to [place] 19 pandemic.  I connected with@ on 04/15/19 at  3:30 PM EDT by a video enabled telemedicine application and verified that I am speaking with the correct person using two identifiers.  Patient location: Home Provider location: Office   I discussed the limitations, risks, security and privacy concerns of performing an evaluation and management service by video enabled telemedicine application and the availability of in person appointments. I also discussed with the patient that there may be a patient responsible charge related to this service. The patient expressed understanding and agreed to proceed.   The persons participating in this telemedicine service were myself and the patient  Interactive audio and video telecommunications were attempted between this provider and patient, however failed, due to patient having technical difficulties OR patient did not have access to video capability. We continued and completed visit with audio only.    Chief complaint: Colorectal cancer screening, IBS HPI:  63 year old male with history of colon polyps here for follow-up visit to discuss surveillance colonoscopy. He started having change in bowel habits about 8 years ago.  Denies any medication or dietary changes at the time but he has changed his diet to vegetarian about 6 years ago.  He has irregular bowel habits with increased frequency and semi-formed stool.  No melena or blood per rectum.  No abdominal pain, nausea, vomiting, decreased appetite or weight loss.  No family history of colon cancer.  His first colonoscopy what was at age 66, had 2 or 3 polyps removed that were precancerous, report is not available for review.  He has been having  colonoscopy every 4 to 5 years since then  Personal history of colon polyps, colonoscopy November 2010 Colonoscopy 04/20/2015: Normal  EGD 07/21/2013 Gastric biopsies positive for H. pylori status post treatment and confirmed eradication  Outpatient Encounter Medications as of 04/15/2019  Medication Sig   aspirin 81 MG tablet Take 81 mg by mouth daily.   ezetimibe-simvastatin (VYTORIN) 10-20 MG tablet Take by mouth daily.   Omega-3 Fatty Acids (OMEGA-3 FISH OIL) 1200 MG CAPS Take 1 capsule by mouth daily.   No facility-administered encounter medications on file as of 04/15/2019.     Allergies as of 04/15/2019 - Review Complete 04/15/2019  Allergen Reaction Noted   Penicillins Rash 07/04/2012    Past Medical History:  Diagnosis Date   Colon polyps    Hypercholesteremia    Prostate cancer ([place]) 04/2016    Past Surgical History:  Procedure Laterality Date   PROSTATECTOMY  04/2016    Family History  Problem Relation Age of Onset   Heart attack Mother     Social History   Socioeconomic History   Marital status: Married    Spouse name: Not on file   Number of children: 0   Years of education: Not on file   Highest education level: Not on file  Occupational History   Occupation: Designer, multimedia strain: Not on file   Food insecurity    Worry: Not on file    Inability: Not on file   Transportation needs    Medical: Not on file    Non-medical: Not on file  Tobacco Use  Smoking status: Never Smoker   Smokeless tobacco: Never Used  Substance and Sexual Activity   Alcohol use: No   Drug use: No   Sexual activity: Not on file  Lifestyle   Physical activity    Days per week: Not on file    Minutes per session: Not on file   Stress: Not on file  Relationships   Social connections    Talks on phone: Not on file    Gets together: Not on file    Attends religious service: Not on file    Active member of club or  organization: Not on file    Attends meetings of clubs or organizations: Not on file    Relationship status: Not on file   Intimate partner violence    Fear of current or ex partner: Not on file    Emotionally abused: Not on file    Physically abused: Not on file    Forced sexual activity: Not on file  Other Topics Concern   Not on file  Social History Narrative   Not on file      Review of systems: Review of Systems as per HPI All other systems reviewed and are negative.   Observations/Objective:   Data Reviewed:  Reviewed labs, radiology imaging, old records and pertinent past GI work up   Assessment and Plan/Recommendations:  63 year old male with history of multiple adenomatous colon polyps starting at age 54, irregular bowel habits with increased frequency and bloating likely secondary to irritable bowel syndrome predominant diarrhea  Last colonoscopy in 2016 with no polyps.  Due for recall colonoscopy in July 2021.  Discussed in detail with patient the potential benefits and risks associated with colonoscopy and advised against doing more frequent colonoscopy than recommended as per current guidelines  IBS-D: Lactose intolerance could be contributing to his symptoms, will do a trial of lactose-free diet for 1 to 2 weeks Start Benefiber 1 teaspoon 3 times daily with meals  History of H. pylori gastritis, status post eradication    I discussed the assessment and treatment plan with the patient. The patient was provided an opportunity to ask questions and all were answered. The patient agreed with the plan and demonstrated an understanding of the instructions.   The patient was advised to call back or seek an in-person evaluation if the symptoms worsen or if the condition fails to improve as anticipated.  I provided 22 minutes of non-face-to-face time during this encounter.   Harl Bowie, MD   CC: Vernie Shanks, MD

## 2019-06-02 MED FILL — EZETIMIBE-SIMVAS 10-40 MG T: 10-40 | 90 days supply | Qty: 90 | Fill #0

## 2019-06-27 ENCOUNTER — Telehealth: Payer: Self-pay | Admitting: Gastroenterology

## 2019-06-27 NOTE — Telephone Encounter (Signed)
If he has persistent diarrhea despite lactose-free diet, we can consider colonoscopy with biopsies to exclude microscopic colitis.  Please schedule office visit if he wants to discuss this further, if not can schedule the procedure as direct.  Thank you

## 2019-06-27 NOTE — Telephone Encounter (Signed)
Pt's wife called and stated that pt's IBS symptoms has not improved since stopping dairy.

## 2019-06-27 NOTE — Telephone Encounter (Signed)
Dr Silverio Decamp the pt called to report that his symptoms of irregular bowel habits, bloating and diarrhea have not resolved.  He did avoid lactose but noticed no changes.  Per your note he is due for a colon July 2021.  He was telling me that you wanted him to have a colon now.  Please advise

## 2019-06-27 NOTE — Telephone Encounter (Signed)
Left message on machine to call back  

## 2019-06-27 NOTE — Telephone Encounter (Signed)
Pt is scheduled with Dr. Silverio Decamp for 07/13/2019

## 2019-07-13 ENCOUNTER — Ambulatory Visit (INDEPENDENT_AMBULATORY_CARE_PROVIDER_SITE_OTHER): Payer: Federal, State, Local not specified - PPO | Admitting: Gastroenterology

## 2019-07-13 ENCOUNTER — Encounter: Payer: Self-pay | Admitting: Gastroenterology

## 2019-07-13 ENCOUNTER — Other Ambulatory Visit: Payer: Self-pay

## 2019-07-13 VITALS — BP 122/72 | HR 66 | Temp 97.4°F | Ht 67.0 in | Wt 145.1 lb

## 2019-07-13 DIAGNOSIS — R197 Diarrhea, unspecified: Secondary | ICD-10-CM | POA: Diagnosis not present

## 2019-07-13 MED ORDER — NA SULFATE-K SULFATE-MG SULF 17.5-3.13-1.6 GM/177ML PO SOLN: ORAL | 0 refills | Status: DC

## 2019-07-13 MED ORDER — COLESTIPOL HCL 1 G PO TABS: 1.0000 g | ORAL_TABLET | Freq: Two times a day (BID) | ORAL | 3 refills | Status: DC

## 2019-07-13 MED FILL — SUPREP BOWEL PREP KIT: 17.5-3.13-1 | 1 days supply | Qty: 354 | Fill #0

## 2019-07-13 NOTE — Progress Notes (Signed)
Karl [name]    0000000    07/18/1956  Primary Care Physician:Wong, Edwyna Shell, MD  Referring Physician: Vernie Shanks, MD [place],  [place] [id number]   Chief complaint:  Diarrhea  HPI: 63 year old male with history of adenomatous colon polyps here with complaints of irregular bowel habits with increased bowel frequency and diarrhea He has 2-4 bowel movements on average daily and sometimes worse with liquid stool and increased urgency.  Denies any rectal bleeding or melena.  No weight loss, decreased appetite, nausea, vomiting or abdominal pain. No family history of GI malignancy or colon cancer.  Did not notice any significant change with lactose-free diet but has not been strictly adherent either.  He normally does not consume much milk or dairy products.  He had an episode of severe diarrhea with abdominal cramping after he had ice cream this past summer.  GI Hx: His first colonoscopy what was at age 49, had 2 or 3 polyps removed that were precancerous, report is not available for review.  He has been having colonoscopy every 4 to 5 years since then  Personal history of colon polyps, colonoscopy November 2010 Colonoscopy 04/20/2015: Normal  EGD 07/21/2013 Gastric biopsies positive for H. pylori status post treatment and confirmed eradication   Outpatient Encounter Medications as of 07/13/2019  Medication Sig  . aspirin 81 MG tablet Take 81 mg by mouth daily.  Marland Kitchen ezetimibe-simvastatin (VYTORIN) 10-20 MG tablet Take by mouth daily.  . Omega-3 Fatty Acids (OMEGA-3 FISH OIL) 1200 MG CAPS Take 1 capsule by mouth daily.   No facility-administered encounter medications on file as of 07/13/2019.     Allergies as of 07/13/2019 - Review Complete 07/13/2019  Allergen Reaction Noted  . Penicillins Rash 07/04/2012    Past Medical History:  Diagnosis Date  . Colon polyps   . Hypercholesteremia   . Prostate cancer ([place]) 04/2016    Past  Surgical History:  Procedure Laterality Date  . PROSTATECTOMY  04/2016    Family History  Problem Relation Age of Onset  . Heart attack Mother     Social History   Socioeconomic History  . Marital status: Married    Spouse name: Not on file  . Number of children: 0  . Years of education: Not on file  . Highest education level: Not on file  Occupational History  . Occupation: Therapist, sports  Social Needs  . Financial resource strain: Not on file  . Food insecurity    Worry: Not on file    Inability: Not on file  . Transportation needs    Medical: Not on file    Non-medical: Not on file  Tobacco Use  . Smoking status: Never Smoker  . Smokeless tobacco: Never Used  Substance and Sexual Activity  . Alcohol use: No  . Drug use: No  . Sexual activity: Not on file  Lifestyle  . Physical activity    Days per week: Not on file    Minutes per session: Not on file  . Stress: Not on file  Relationships  . Social Herbalist on phone: Not on file    Gets together: Not on file    Attends religious service: Not on file    Active member of club or organization: Not on file    Attends meetings of clubs or organizations: Not on file    Relationship status: Not on file  . Intimate partner  violence    Fear of current or ex partner: Not on file    Emotionally abused: Not on file    Physically abused: Not on file    Forced sexual activity: Not on file  Other Topics Concern  . Not on file  Social History Narrative  . Not on file      Review of systems: Review of Systems  Constitutional: Negative for fever and chills.  HENT: Positive for sinus problem  Eyes: Negative for blurred vision.  Respiratory: Negative for cough, shortness of breath and wheezing.   Cardiovascular: Negative for chest pain and palpitations.  Gastrointestinal: as per HPI Genitourinary: Negative for dysuria, urgency, frequency and hematuria.  Musculoskeletal: Negative for myalgias, back pain and joint  pain.  Skin: Negative for itching and rash.  Neurological: Negative for dizziness, tremors, focal weakness, seizures and loss of consciousness.  Endo/Heme/Allergies: Positive for seasonal allergies.  Psychiatric/Behavioral: Negative for depression, suicidal ideas and hallucinations.  All other systems reviewed and are negative.   Physical Exam: Vitals:   07/13/19 0826  BP: 122/72  Pulse: 66  Temp: (!) 97.4 F (36.3 C)   Body mass index is 22.73 kg/m. Gen:      No acute distress HEENT:  EOMI, sclera anicteric Neck:     No masses; no thyromegaly Lungs:    Clear to auscultation bilaterally; normal respiratory effort CV:         Regular rate and rhythm; no murmurs Abd:      + bowel sounds; soft, non-tender; no palpable masses, no distension Ext:    No edema; adequate peripheral perfusion Skin:      Warm and dry; no rash Neuro: alert and oriented x 3 Psych: normal mood and affect  Data Reviewed:  Reviewed labs, radiology imaging, old records and pertinent past GI work up   Assessment and Plan/Recommendations:  63 year old male with history of multiple adenomatous colon polyps starting at age 47 with complaints of irregular bowel habits and diarrhea Will schedule for colonoscopy for further evaluation, exclude microscopic colitis The risks and benefits as well as alternatives of endoscopic procedure(s) have been discussed and reviewed. All questions answered. The patient agrees to proceed.  Discussed lactose-free diet, will do a trial of lactose-free diet for 1 to 2 weeks  Benefiber 1 tablespoon 3 times daily with meals  If continues to have persistent diarrhea in the bowel work-up negative, will consider trial of Colestid 1 g twice daily for possible bile salt induced diarrhea  Return in 6 months or sooner if needed   K. Denzil Magnuson , MD    CC: Vernie Shanks, MD

## 2019-07-13 NOTE — Patient Instructions (Signed)
You have been scheduled for a colonoscopy. Please follow written instructions given to you at your visit today.  Please pick up your prep supplies at the pharmacy within the next 1-3 days. If you use inhalers (even only as needed), please bring them with you on the day of your procedure.   We have sent Colestid to your pharmacy   Ok to use Lactaid tablets as needed   Follow up in 6 months   Lactose-Free Diet, Adult If you have lactose intolerance, you are not able to digest lactose. Lactose is a natural sugar found mainly in dairy milk and dairy products. You may need to avoid all foods and beverages that contain lactose. A lactose-free diet can help you do this. Which foods have lactose? Lactose is found in dairy milk and dairy products, such as:  Yogurt.  Cheese.  Butter.  Margarine.  Sour cream.  Cream.  Whipped toppings and nondairy creamers.  Ice cream and other dairy-based desserts. Lactose is also found in foods or products made with dairy milk or milk ingredients. To find out whether a food contains dairy milk or a milk ingredient, look at the ingredients list. Avoid foods with the statement "May contain milk" and foods that contain:  Milk powder.  Whey.  Curd.  Caseinate.  Lactose.  Lactalbumin.  Lactoglobulin. What are alternatives to dairy milk and foods made with milk products?  Lactose-free milk.  Soy milk with added calcium and vitamin D.  Almond milk, coconut milk, rice milk, or other nondairy milk alternatives with added calcium and vitamin D. Note that these are low in protein.  Soy products, such as soy yogurt, soy cheese, soy ice cream, and soy-based sour cream.  Other nut milk products, such as almond yogurt, almond cheese, cashew yogurt, cashew cheese, cashew ice cream, coconut yogurt, and coconut ice cream. What are tips for following this plan?  Do not consume foods, beverages, vitamins, minerals, or medicines containing lactose. Read  ingredient lists carefully.  Look for the words "lactose-free" on labels.  Use lactase enzyme drops or tablets as directed by your health care provider.  Use lactose-free milk or a milk alternative, such as soy milk or almond milk, for drinking and cooking.  Make sure you get enough calcium and vitamin D in your diet. A lactose-free eating plan can be lacking in these important nutrients.  Take calcium and vitamin D supplements as directed by your health care provider. Talk to your health care provider about supplements if you are not able to get enough calcium and vitamin D from food. What foods can I eat?  Fruits All fresh, canned, frozen, or dried fruits that are not processed with lactose. Vegetables All fresh, frozen, and canned vegetables without cheese, cream, or butter sauces. Grains Any that are not made with dairy milk or dairy products. Meats and other proteins Any meat, fish, poultry, and other protein sources that are not made with dairy milk or dairy products. Soy cheese and yogurt. Fats and oils Any that are not made with dairy milk or dairy products. Beverages Lactose-free milk. Soy, rice, or almond milk with added calcium and vitamin D. Fruit and vegetable juices. Sweets and desserts Any that are not made with dairy milk or dairy products. Seasonings and condiments Any that are not made with dairy milk or dairy products. Calcium Calcium is found in many foods that contain lactose and is important for bone health. The amount of calcium you need depends on your age:  Adults younger than 50 years: 1,000 mg of calcium a day.  Adults older than 50 years: 1,200 mg of calcium a day. If you are not getting enough calcium, you may get it from other sources, including:  Orange juice with calcium added. There are 300-350 mg of calcium in 1 cup of orange juice.  Calcium-fortified soy milk. There are 300-400 mg of calcium in 1 cup of calcium-fortified soy milk.   Calcium-fortified rice or almond milk. There are 300 mg of calcium in 1 cup of calcium-fortified rice or almond milk.  Calcium-fortified breakfast cereals. There are 100-1,000 mg of calcium in calcium-fortified breakfast cereals.  Spinach, cooked. There are 145 mg of calcium in  cup of cooked spinach.  Edamame, cooked. There are 130 mg of calcium in  cup of cooked edamame.  Collard greens, cooked. There are 125 mg of calcium in  cup of cooked collard greens.  Kale, frozen or cooked. There are 90 mg of calcium in  cup of cooked or frozen kale.  Almonds. There are 95 mg of calcium in  cup of almonds.  Broccoli, cooked. There are 60 mg of calcium in 1 cup of cooked broccoli. The items listed above may not be a complete list of recommended foods and beverages. Contact a dietitian for more options. What foods are not recommended? Fruits None, unless they are made with dairy milk or dairy products. Vegetables None, unless they are made with dairy milk or dairy products. Grains Any grains that are made with dairy milk or dairy products. Meats and other proteins None, unless they are made with dairy milk or dairy products. Dairy All dairy products, including milk, goat's milk, buttermilk, kefir, acidophilus milk, flavored milk, evaporated milk, condensed milk, dulce de [place], eggnog, yogurt, cheese, and cheese spreads. Fats and oils Any that are made with milk or milk products. Margarines and salad dressings that contain milk or cheese. Cream. Half and half. Cream cheese. Sour cream. Chip dips made with sour cream or yogurt. Beverages Hot chocolate. Cocoa with lactose. Instant iced teas. Powdered fruit drinks. Smoothies made with dairy milk or yogurt. Sweets and desserts Any that are made with milk or milk products. Seasonings and condiments Chewing gum that has lactose. Spice blends if they contain lactose. Artificial sweeteners that contain lactose. Nondairy creamers. The items  listed above may not be a complete list of foods and beverages to avoid. Contact a dietitian for more information. Summary  If you are lactose intolerant, it means that you have a hard time digesting lactose, a natural sugar found in milk and milk products.  Following a lactose-free diet can help you manage this condition.  Calcium is important for bone health and is found in many foods that contain lactose. Talk with your health care provider about other sources of calcium. This information is not intended to replace advice given to you by your health care provider. Make sure you discuss any questions you have with your health care provider. Document Released: 03/07/2002 Document Revised: 10/13/2017 Document Reviewed: 10/13/2017 Elsevier Patient Education  [employer].   I appreciate the  opportunity to care for you  Thank You   Harl Bowie , MD

## 2019-07-15 ENCOUNTER — Encounter: Payer: Self-pay | Admitting: Gastroenterology

## 2019-07-22 ENCOUNTER — Encounter: Payer: Federal, State, Local not specified - PPO | Admitting: Gastroenterology

## 2019-07-27 ENCOUNTER — Encounter: Payer: Federal, State, Local not specified - PPO | Admitting: Gastroenterology

## 2019-08-03 ENCOUNTER — Encounter: Payer: Federal, State, Local not specified - PPO | Admitting: Gastroenterology

## 2019-08-17 ENCOUNTER — Encounter: Payer: Self-pay | Admitting: Gastroenterology

## 2019-08-17 ENCOUNTER — Ambulatory Visit (AMBULATORY_SURGERY_CENTER): Payer: Federal, State, Local not specified - PPO | Admitting: Gastroenterology

## 2019-08-17 ENCOUNTER — Other Ambulatory Visit: Payer: Self-pay

## 2019-08-17 VITALS — BP 130/77 | HR 67 | Temp 97.6°F | Resp 14 | Ht 67.0 in | Wt 145.0 lb

## 2019-08-17 DIAGNOSIS — R197 Diarrhea, unspecified: Secondary | ICD-10-CM | POA: Diagnosis not present

## 2019-08-17 DIAGNOSIS — K648 Other hemorrhoids: Secondary | ICD-10-CM | POA: Diagnosis not present

## 2019-08-17 DIAGNOSIS — K5730 Diverticulosis of large intestine without perforation or abscess without bleeding: Secondary | ICD-10-CM

## 2019-08-17 MED ORDER — SODIUM CHLORIDE 0.9 % IV SOLN: 500.0000 mL | INTRAVENOUS | Status: DC

## 2019-08-17 NOTE — Progress Notes (Signed)
Called to room to assist during endoscopic procedure.  Patient ID and intended procedure confirmed with present staff. Received instructions for my participation in the procedure from the performing physician.  

## 2019-08-17 NOTE — Progress Notes (Signed)
Report to PACU, RN, vss, BBS= Clear.  

## 2019-08-17 NOTE — Op Note (Signed)
[name] Procedure Date: 08/17/2019 10:00 AM MRN: [id number] Endoscopist: Mauri Pole , MD Age: 63 Referring MD:  Date of Birth: 1956/07/16 Gender: Male Account #: [id number] Procedure:                Colonoscopy Indications:              Clinically significant diarrhea of unexplained                            origin Medicines:                Monitored Anesthesia Care Procedure:                Pre-Anesthesia Assessment:                           - Prior to the procedure, a History and Physical                            was performed, and patient medications and                            allergies were reviewed. The patient's tolerance of                            previous anesthesia was also reviewed. The risks                            and benefits of the procedure and the sedation                            options and risks were discussed with the patient.                            All questions were answered, and informed consent                            was obtained. Prior Anticoagulants: The patient has                            taken no previous anticoagulant or antiplatelet                            agents. ASA Grade Assessment: II - A patient with                            mild systemic disease. After reviewing the risks                            and benefits, the patient was deemed in                            satisfactory condition to undergo the procedure.  After obtaining informed consent, the colonoscope                            was passed under direct vision. Throughout the                            procedure, the patient's blood pressure, pulse, and                            oxygen saturations were monitored continuously. The                            Colonoscope was introduced through the anus and                            advanced to the the terminal ileum, with                  identification of the appendiceal orifice and IC                            valve. The colonoscopy was performed without                            difficulty. The patient tolerated the procedure                            well. The quality of the bowel preparation was                            excellent. The terminal ileum, ileocecal valve,                            appendiceal orifice, and rectum were photographed. Scope In: 10:05:48 AM Scope Out: 10:21:31 AM Scope Withdrawal Time: 0 hours 13 minutes 15 seconds  Total Procedure Duration: 0 hours 15 minutes 43 seconds  Findings:                 The perianal and digital rectal examinations were                            normal.                           Normal mucosa was found in the entire colon.                            Biopsies for histology were taken with a cold                            forceps from the right colon and left colon for                            evaluation of microscopic colitis.  A few small-mouthed diverticula were found in the                            sigmoid colon and descending colon.                           Non-bleeding internal hemorrhoids were found during                            retroflexion. The hemorrhoids were small.                           The exam was otherwise without abnormality. Complications:            No immediate complications. Estimated Blood Loss:     Estimated blood loss was minimal. Impression:               - Normal mucosa in the entire examined colon.                            Biopsied.                           - Mild diverticulosis in the sigmoid colon and in                            the descending colon.                           - Non-bleeding internal hemorrhoids.                           - The examination was otherwise normal. Recommendation:           - Patient has a contact number available for                             emergencies. The signs and symptoms of potential                            delayed complications were discussed with the                            patient. Return to normal activities tomorrow.                            Written discharge instructions were provided to the                            patient.                           - Resume previous diet.                           - Continue present medications.                           -  Await pathology results.                           - Repeat colonoscopy in 5 years for surveillance                            based on pathology results due to personal history                            of colon polyps. Mauri Pole, MD 08/17/2019 10:26:37 AM This report has been signed electronically.

## 2019-08-17 NOTE — Patient Instructions (Signed)
Please read handouts provided. Continue present medications. Await pathology results.        YOU HAD AN ENDOSCOPIC PROCEDURE TODAY AT THE [facility]:   Refer to the procedure report that was given to you for any specific questions about what was found during the examination.  If the procedure report does not answer your questions, please call your gastroenterologist to clarify.  If you requested that your care partner not be given the details of your procedure findings, then the procedure report has been included in a sealed envelope for you to review at your convenience later.  YOU SHOULD EXPECT: Some feelings of bloating in the abdomen. Passage of more gas than usual.  Walking can help get rid of the air that was put into your GI tract during the procedure and reduce the bloating. If you had a lower endoscopy (such as a colonoscopy or flexible sigmoidoscopy) you may notice spotting of blood in your stool or on the toilet paper. If you underwent a bowel prep for your procedure, you may not have a normal bowel movement for a few days.  Please Note:  You might notice some irritation and congestion in your nose or some drainage.  This is from the oxygen used during your procedure.  There is no need for concern and it should clear up in a day or so.  SYMPTOMS TO REPORT IMMEDIATELY:   Following lower endoscopy (colonoscopy or flexible sigmoidoscopy):  Excessive amounts of blood in the stool  Significant tenderness or worsening of abdominal pains  Swelling of the abdomen that is new, acute  Fever of 100F or higher    For urgent or emergent issues, a gastroenterologist can be reached at any hour by calling [phone].   DIET:  We do recommend a small meal at first, but then you may proceed to your regular diet.  Drink plenty of fluids but you should avoid alcoholic beverages for 24 hours.  ACTIVITY:  You should plan to take it easy for the rest of today and you should NOT  DRIVE or use heavy machinery until tomorrow (because of the sedation medicines used during the test).    FOLLOW UP: Our staff will call the number listed on your records 48-72 hours following your procedure to check on you and address any questions or concerns that you may have regarding the information given to you following your procedure. If we do not reach you, we will leave a message.  We will attempt to reach you two times.  During this call, we will ask if you have developed any symptoms of COVID 19. If you develop any symptoms (ie: fever, flu-like symptoms, shortness of breath, cough etc.) before then, please call [phone].  If you test positive for Covid 19 in the 2 weeks post procedure, please call and report this information to us.    If any biopsies were taken you will be contacted by phone or by letter within the next 1-3 weeks.  Please call us at [phone] if you have not heard about the biopsies in 3 weeks.    SIGNATURES/CONFIDENTIALITY: You and/or your care partner have signed paperwork which will be entered into your electronic medical record.  These signatures attest to the fact that that the information above on your After Visit Summary has been reviewed and is understood.  Full responsibility of the confidentiality of this discharge information lies with you and/or your care-partner. 

## 2019-08-17 NOTE — Progress Notes (Signed)
Temp JB  vs CW 

## 2019-08-19 ENCOUNTER — Telehealth: Payer: Self-pay | Admitting: *Deleted

## 2019-08-19 NOTE — Telephone Encounter (Signed)
  Follow up Call-  Call back number 08/17/2019  Post procedure Call Back phone  # [phone]  Permission to leave phone message Yes  Some recent data might be hidden     Patient questions:  Do you have a fever, pain , or abdominal swelling? No. Pain Score  0 *  Have you tolerated food without any problems? Yes.    Have you been able to return to your normal activities? Yes.    Do you have any questions about your discharge instructions: Diet   No. Medications  No. Follow up visit  No.  Do you have questions or concerns about your Care? No.  Actions: * If pain score is 4 or above: No action needed, pain <4.  1. Have you developed a fever since your procedure? no  2.   Have you had an respiratory symptoms (SOB or cough) since your procedure? no  3.   Have you tested positive for COVID 19 since your procedure no  4.   Have you had any family members/close contacts diagnosed with the COVID 19 since your procedure?  no   If yes to any of these questions please route to Joylene John, RN and Alphonsa Gin, Therapist, sports.

## 2019-08-19 NOTE — Telephone Encounter (Signed)
First follow up call attempt.  Message left on voicemail. 

## 2019-08-23 ENCOUNTER — Encounter: Payer: Self-pay | Admitting: Gastroenterology

## 2019-09-09 MED FILL — EZETIMIBE-SIMVAS 10-40 MG T: 10-40 | 90 days supply | Qty: 90 | Fill #0

## 2019-11-06 ENCOUNTER — Other Ambulatory Visit: Payer: Self-pay | Admitting: Gastroenterology

## 2019-12-01 ENCOUNTER — Telehealth: Payer: Self-pay | Admitting: *Deleted

## 2019-12-01 MED ORDER — COLESTIPOL HCL 1 G PO TABS: 1.0000 g | ORAL_TABLET | Freq: Two times a day (BID) | ORAL | 1 refills | Status: DC

## 2019-12-01 NOTE — Telephone Encounter (Signed)
Hi Dr. Silverio Decamp.  My pharmacist at [facility] told me that I need your approval for Colestipol refill.  Tx. Laqueta Due   Sent refill to pts pharmacy

## 2020-04-03 ENCOUNTER — Encounter: Payer: Self-pay | Admitting: Family Medicine

## 2020-04-03 ENCOUNTER — Ambulatory Visit (INDEPENDENT_AMBULATORY_CARE_PROVIDER_SITE_OTHER): Payer: Medicare Other

## 2020-04-03 ENCOUNTER — Ambulatory Visit: Payer: Self-pay

## 2020-04-03 ENCOUNTER — Other Ambulatory Visit: Payer: Self-pay

## 2020-04-03 ENCOUNTER — Ambulatory Visit (INDEPENDENT_AMBULATORY_CARE_PROVIDER_SITE_OTHER): Payer: Medicare Other | Admitting: Family Medicine

## 2020-04-03 VITALS — BP 118/76 | HR 70 | Ht 67.0 in | Wt 145.0 lb

## 2020-04-03 DIAGNOSIS — M25562 Pain in left knee: Secondary | ICD-10-CM

## 2020-04-03 DIAGNOSIS — M222X2 Patellofemoral disorders, left knee: Secondary | ICD-10-CM | POA: Diagnosis not present

## 2020-04-03 NOTE — Assessment & Plan Note (Signed)
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  Patient does have some mild arthritic changes that I think is contributing as well.  We discussed with activities which wants to do which wants to avoid.  Patient is to increase activity slowly.  Discussed with worsening pain physical therapy and bracing.

## 2020-04-03 NOTE — Patient Instructions (Signed)
Vit D 2000IU Ice after activity More biking than running  Check back in 6 weeks

## 2020-04-03 NOTE — Progress Notes (Signed)
[place] Phone: [phone] Subjective:   Karl [name], am serving as a scribe for Dr. Hulan Saas. This visit occurred during the SARS-CoV-2 public health emergency.  Safety protocols were in place, including screening questions prior to the visit, additional usage of staff PPE, and extensive cleaning of exam room while observing appropriate contact time as indicated for disinfecting solutions.   I'm seeing this patient by the request  of:  Karl Shanks, MD  CC: left knee pain   [id number]  Karl [name] is a 64 y.o. male coming in with complaint of left knee pain for 2 months. Pain is intermittent and over front of knee. Pain with knee flexion. Pain is burning. Rests for relief.       Past Medical History:  Diagnosis Date  . Colon polyps   . Hypercholesteremia   . Prostate cancer ([place]) 04/2016   Past Surgical History:  Procedure Laterality Date  . PROSTATECTOMY  04/2016   Social History   Socioeconomic History  . Marital status: Married    Spouse name: Not on file  . Number of children: 0  . Years of education: Not on file  . Highest education level: Not on file  Occupational History  . Occupation: Therapist, sports  Tobacco Use  . Smoking status: Never Smoker  . Smokeless tobacco: Never Used  Vaping Use  . Vaping Use: Never used  Substance and Sexual Activity  . Alcohol use: [name]  . Drug use: [name]  . Sexual activity: Not on file  Other Topics Concern  . Not on file  Social History Narrative  . Not on file   Social Determinants of Health   Financial Resource Strain:   . Difficulty of Paying Living Expenses:   Food Insecurity:   . Worried About Charity fundraiser in the Last Year:   . Arboriculturist in the Last Year:   Transportation Needs:   . Film/video editor (Medical):   Marland Kitchen Lack of Transportation (Non-Medical):   Physical Activity:   . Days of Exercise per Week:   . Minutes of  Exercise per Session:   Stress:   . Feeling of Stress :   Social Connections:   . Frequency of Communication with Friends and Family:   . Frequency of Social Gatherings with Friends and Family:   . Attends Religious Services:   . Active Member of Clubs or Organizations:   . Attends Archivist Meetings:   Marland Kitchen Marital Status:    Allergies  Allergen Reactions  . Penicillins Rash   Family History  Problem Relation Age of Onset  . Heart attack Mother      Current Outpatient Medications (Cardiovascular):  .  colestipol (COLESTID) 1 g tablet, Take 1 tablet (1 g total) by mouth 2 (two) times daily.   Current Outpatient Medications (Analgesics):  .  aspirin 81 MG tablet, Take 81 mg by mouth daily.  Current Outpatient Medications (Hematological):  Marland Kitchen  VIT B6-VIT B12-FA-GINGER TD, Take 1,000 mcg by mouth every morning.  Current Outpatient Medications (Other):  Marland Kitchen  Na Sulfate-K Sulfate-Mg Sulf 17.5-3.13-1.6 GM/177ML SOLN, 1 kit .  Omega-3 Fatty Acids (OMEGA-3 FISH OIL) 1200 MG CAPS, Take 1 capsule by mouth daily.   Reviewed prior external information including notes and imaging from  primary care provider As well as notes that were available from care everywhere and other healthcare systems.  Past medical history, social, surgical  and family history all reviewed in electronic medical record.  [name] pertanent information unless stated regarding to the chief complaint.   Review of Systems:  [name] headache, visual changes, nausea, vomiting, diarrhea, constipation, dizziness, abdominal pain, skin rash, fevers, chills, night sweats, weight loss, swollen lymph nodes, body aches, joint swelling, chest pain, shortness of breath, mood changes. POSITIVE muscle aches  Objective  Blood pressure 118/76, pulse 70, height _0  (1.702 m), weight 145 lb (65.8 kg), SpO2 99 %.   General: [name] apparent distress alert and oriented x3 mood and affect normal, dressed appropriately.  HEENT: Pupils equal,  extraocular movements intact  Respiratory: Patient's speak in full sentences and does not appear short of breath  Cardiovascular: [name] lower extremity edema, non tender, [name] erythema  Neuro: Cranial nerves II through XII are intact, neurovascularly intact in all extremities with 2+ DTRs and 2+ pulses.  Gait normal with good balance and coordination.  MSK: Left knee exam shows the patient does have some atrophy of the VMO compared to contralateral side.  Mild lateral tracking of the patella noted as well.  Minimal discomfort of the knee in the medial and lateral aspects of the meniscus.  Negative McMurray's.  Full range of motion [name] noted.  Limited musculoskeletal ultrasound was performed and interpreted by Karl [name]  Limited ultrasound shows the patient does have moderate narrowing of the patellofemoral joint with trace effusion noted.  Patient's meniscus do have some degenerative changes but [name] true acute tear appreciated.    Impression and Recommendations:     The above documentation has been reviewed and is accurate and complete Karl Pulley, DO       Note: This dictation was prepared with Dragon dictation along with smaller phrase technology. Any transcriptional errors that result from this process are unintentional.

## 2020-04-13 ENCOUNTER — Other Ambulatory Visit: Payer: Self-pay

## 2020-04-13 ENCOUNTER — Ambulatory Visit (INDEPENDENT_AMBULATORY_CARE_PROVIDER_SITE_OTHER): Payer: Medicare Other | Admitting: Family Medicine

## 2020-04-13 DIAGNOSIS — S8011XA Contusion of right lower leg, initial encounter: Secondary | ICD-10-CM

## 2020-04-13 NOTE — Patient Instructions (Signed)
Thank you for coming in today. I think this is a hematoma like you think.  Compression is good.  Do the calf exercises we discussed.  A baby aspirin is not a bad idea.  If your leg swelling worsens let me know and I will arrange for a DVT ultrasound.  I do not think that wlll happen.

## 2020-04-13 NOTE — Progress Notes (Signed)
   I, Wendy Poet, LAT, ATC, am serving as scribe for Dr. Lynne Leader.  Karl [name] is a 64 y.o. male who presents to [place] [facility] today for ankle pain.  He was last seen by Dr. Tamala Julian on 04/03/20 for L knee pain.  Since his last visit, pt reports having injured his right leg.  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  This caused a large hematoma.  He treated it with compression and now notes that the fluid from the calf has pushed down into his ankle and foot and is causing foot and ankle pain.  Is quite obnoxious last night but is feeling a lot better today.  He denies any further leg swelling and feels pretty well overall. He takes 81 mg of aspirin daily.   Pertinent review of systems: No fevers or chills  Relevant historical information: History of prostate cancer   Exam:  General: Well Developed, well nourished, and in no acute distress.   MSK: Right calf slightly swollen tender to palpation lateral gastrocnemius muscle belly. Bony prominences are nontender and tibia and fibula proximally and distally. Some soft tissue swelling and bruising present inferior medial and lateral  foot and ankle. Ankle and foot nontender with normal motion and strength.   Stable ligamentous exam    Assessment and Plan: 64 y.o. male with right calf hematoma draining to foot and ankle causing pain.  This is now resolving with compression.  No evidence of DVT at this time.  Since he is already improving plan for home exercise program taught in clinic today.  Also recommend compression sleeve.  Recheck back if not improving.    Discussed warning signs or symptoms. Please see discharge instructions. Patient expresses understanding.   The above documentation has been reviewed and is accurate and complete Lynne Leader, M.D.

## 2020-05-17 ENCOUNTER — Ambulatory Visit: Payer: Medicare Other | Admitting: Family Medicine

## 2020-05-30 NOTE — Progress Notes (Signed)
[place] [address] [place] Phone: [phone] Subjective:   I Kandace Blitz am serving as a Education administrator for Dr. Hulan Saas.  This visit occurred during the SARS-CoV-2 public health emergency.  Safety protocols were in place, including screening questions prior to the visit, additional usage of staff PPE, and extensive cleaning of exam room while observing appropriate contact time as indicated for disinfecting solutions.   I'm seeing this patient by the request  of:  Vernie Shanks, MD  CC: Knee pain follow-up  [id number]   04/03/2020 Patient does have what appears to be more of a patellofemoral syndrome.  Patient does have some mild arthritic changes that I think is contributing as well.  We discussed with activities which wants to do which wants to avoid.  Patient is to increase activity slowly.  Discussed with worsening pain physical therapy and bracing.  Update 06/04/7892 Dorean Daniello is a 64 y.o. male coming in with complaint of left knee pain. Saw Dr. Georgina Snell on 04/13/2020 for right calf, hematoma. Patient states that his calf is doing well. Knee is achy if he weight bears.        Past Medical History:  Diagnosis Date  . Colon polyps   . Hypercholesteremia   . Prostate cancer ([place]) 04/2016   Past Surgical History:  Procedure Laterality Date  . PROSTATECTOMY  04/2016   Social History   Socioeconomic History  . Marital status: Married    Spouse name: Not on file  . Number of children: 0  . Years of education: Not on file  . Highest education level: Not on file  Occupational History  . Occupation: Therapist, sports  Tobacco Use  . Smoking status: Never Smoker  . Smokeless tobacco: Never Used  Vaping Use  . Vaping Use: Never used  Substance and Sexual Activity  . Alcohol use: No  . Drug use: No  . Sexual activity: Not on file  Other Topics Concern  . Not on file  Social History Narrative  . Not on file   Social  Determinants of Health   Financial Resource Strain:   . Difficulty of Paying Living Expenses: Not on file  Food Insecurity:   . Worried About Charity fundraiser in the Last Year: Not on file  . Ran Out of Food in the Last Year: Not on file  Transportation Needs:   . Lack of Transportation (Medical): Not on file  . Lack of Transportation (Non-Medical): Not on file  Physical Activity:   . Days of Exercise per Week: Not on file  . Minutes of Exercise per Session: Not on file  Stress:   . Feeling of Stress : Not on file  Social Connections:   . Frequency of Communication with Friends and Family: Not on file  . Frequency of Social Gatherings with Friends and Family: Not on file  . Attends Religious Services: Not on file  . Active Member of Clubs or Organizations: Not on file  . Attends Archivist Meetings: Not on file  . Marital Status: Not on file   Allergies  Allergen Reactions  . Penicillins Rash   Family History  Problem Relation Age of Onset  . Heart attack Mother      Current Outpatient Medications (Cardiovascular):  .  colestipol (COLESTID) 1 g tablet, Take 1 tablet (1 g total) by mouth 2 (two) times daily.   Current Outpatient Medications (Analgesics):  .  aspirin 81 MG tablet, Take  81 mg by mouth daily.  Current Outpatient Medications (Hematological):  .  VIT B6-VIT B12-FA-GINGER TD, Take 1,000 mcg by mouth every morning.  Current Outpatient Medications (Other):  .  Na Sulfate-K Sulfate-Mg Sulf 17.5-3.13-1.6 GM/177ML SOLN, 1 kit .  Omega-3 Fatty Acids (OMEGA-3 FISH OIL) 1200 MG CAPS, Take 1 capsule by mouth daily.   Reviewed prior external information including notes and imaging from  primary care provider As well as notes that were available from care everywhere and other healthcare systems.  Past medical history, social, surgical and family history all reviewed in electronic medical record.  No pertanent information unless stated regarding to the  chief complaint.   Review of Systems:  No headache, visual changes, nausea, vomiting, diarrhea, constipation, dizziness, abdominal pain, skin rash, fevers, chills, night sweats, weight loss, swollen lymph nodes, body aches, joint swelling, chest pain, shortness of breath, mood changes. POSITIVE muscle aches  Objective  Blood pressure 120/80, pulse 74, height 5' 7" (1.702 m), weight 140 lb (63.5 kg), SpO2 95 %.   General: No apparent distress alert and oriented x3 mood and affect normal, dressed appropriately.  HEENT: Pupils equal, extraocular movements intact  Respiratory: Patient's speak in full sentences and does not appear short of breath  Cardiovascular: No lower extremity edema, non tender, no erythema  Neuro: Cranial nerves II through XII are intact, neurovascularly intact in all extremities with 2+ DTRs and 2+ pulses.  Gait normal with good balance and coordination.  MSK: Left knee exam shows the patient does have some very mild tenderness noted on the lateral aspect of the patella.  Very mild positive patellar grind test.  Patient has no swelling noted, full range of motion, no instability of the knee itself.   Impression and Recommendations:     The above documentation has been reviewed and is accurate and complete Zachary M Smith, DO       Note: This dictation was prepared with Dragon dictation along with smaller phrase technology. Any transcriptional errors that result from this process are unintentional.         

## 2020-05-31 ENCOUNTER — Other Ambulatory Visit: Payer: Self-pay

## 2020-05-31 ENCOUNTER — Ambulatory Visit (INDEPENDENT_AMBULATORY_CARE_PROVIDER_SITE_OTHER): Payer: Medicare Other | Admitting: Family Medicine

## 2020-05-31 ENCOUNTER — Encounter: Payer: Self-pay | Admitting: Family Medicine

## 2020-05-31 DIAGNOSIS — M222X2 Patellofemoral disorders, left knee: Secondary | ICD-10-CM

## 2020-05-31 NOTE — Assessment & Plan Note (Signed)
Patient is doing relatively well.  Only has the pain when he is helping his knee Sister with transition.  Patient states that when he works out on a regular basis no significant discomfort at the moment.  Patient given a Tru pull lite brace to wear with helping for some stability of it.  Patient is doing well with the exercises.  As long as patient is well can follow-up as needed

## 2020-05-31 NOTE — Patient Instructions (Signed)
Knee brace when you're moving your sister See me in 2 months

## 2020-07-04 ENCOUNTER — Ambulatory Visit (INDEPENDENT_AMBULATORY_CARE_PROVIDER_SITE_OTHER): Payer: Medicare Other

## 2020-07-04 ENCOUNTER — Other Ambulatory Visit: Payer: Self-pay

## 2020-07-04 DIAGNOSIS — M79642 Pain in left hand: Secondary | ICD-10-CM | POA: Diagnosis not present

## 2020-07-31 ENCOUNTER — Ambulatory Visit: Payer: Medicare Other | Admitting: Family Medicine

## 2020-08-15 ENCOUNTER — Other Ambulatory Visit: Payer: Self-pay | Admitting: Gastroenterology

## 2020-08-16 ENCOUNTER — Telehealth: Payer: Self-pay

## 2020-08-16 NOTE — Telephone Encounter (Signed)
Left message for patient to call back to be seen by Dr. Georgina Snell or Dr. Thompson Caul first available appointment.

## 2020-08-16 NOTE — Telephone Encounter (Signed)
Patient called wanting an appointment with Dr. Tamala Julian was hoping to be seen before December. I did not know what spot willing to fit in. Patient would like a call back. Patient is having low back pain and spasms, has been wearing the lidocaine patches but it is still not helping.

## 2020-08-16 NOTE — Telephone Encounter (Signed)
Patient scheduled with Dr. Georgina Snell for Monday the 22nd

## 2020-08-17 NOTE — Progress Notes (Signed)
    Karl [name] is a 64 y.o. male who presents to [place] at [facility] today for low back pain/spasms.  He was last seen by Dr. Tamala Julian on 05/31/20 for L knee pain and was advised to use a knee brace.  Since his last visit w/ Dr. Tamala Julian, pt reports low back pain x 5-6 days after helping/moving his disabled sister.  He notes radiating pain up toward his R mid-back.  He has been using heat and stretching since he injured his back.  He has also been taking IBU and Tylenol.    Diagnostic testing: L-spine XR- 12/04/06  Pertinent review of systems: No fevers or chills  Relevant historical information: Hyperlipidemia.  Works in the [employer].   Exam:  BP 128/70 (BP Location: Right Arm, Patient Position: Sitting, Cuff Size: Normal)   Pulse 63   Ht 5\' 7"  (1.702 m)   Wt 145 lb 3.2 oz (65.9 kg)   SpO2 98%   BMI 22.74 kg/m  General: Well Developed, well nourished, and in no acute distress.   MSK: Spine: T-spine nontender to midline normal-appearing Tender palpation right paraspinal musculature and rhomboid. Normal thoracic motion.  Number of extremity motion strength and sensation.  L-spine: Normal-appearing nontender midline.  Tender palpation paraspinal musculature. Decreased lumbar motion.  Lower extremity strength reflexes and sensation are equal and normal throughout.    Lab and Radiology Results  X-ray images of lumbar spine obtained during CT scan abdomen and pelvis August 17, 2013 personally independently interpreted today.   Mild degenerative changes present.     Assessment and Plan: 64 y.o. male with few day onset of lumbar and thoracic back pain after lifting.  Pain due to muscle spasm and dysfunction.  Plan for heat relative rest TENS unit and tizanidine.  We will also refer to physical therapy.  If not approved we will proceed with further work-up and evaluation.   PDMP not reviewed this encounter. Orders Placed This Encounter  Procedures  .  Ambulatory referral to Physical Therapy    Referral Priority:   Routine    Referral Type:   Physical Medicine    Referral Reason:   Specialty Services Required    Requested Specialty:   Physical Therapy    Number of Visits Requested:   1   Meds ordered this encounter  Medications  . tiZANidine (ZANAFLEX) 4 MG tablet    Sig: Take 1 tablet (4 mg total) by mouth every 6 (six) hours as needed for muscle spasms.    Dispense:  30 tablet    Refill:  1     Discussed warning signs or symptoms. Please see discharge instructions. Patient expresses understanding.   The above documentation has been reviewed and is accurate and complete Lynne Leader, M.D.

## 2020-08-20 ENCOUNTER — Ambulatory Visit (INDEPENDENT_AMBULATORY_CARE_PROVIDER_SITE_OTHER): Payer: Medicare Other | Admitting: Family Medicine

## 2020-08-20 ENCOUNTER — Other Ambulatory Visit: Payer: Self-pay

## 2020-08-20 ENCOUNTER — Encounter: Payer: Self-pay | Admitting: Family Medicine

## 2020-08-20 VITALS — BP 128/70 | HR 63 | Ht 67.0 in | Wt 145.2 lb

## 2020-08-20 DIAGNOSIS — S39012A Strain of muscle, fascia and tendon of lower back, initial encounter: Secondary | ICD-10-CM | POA: Diagnosis not present

## 2020-08-20 DIAGNOSIS — S29012A Strain of muscle and tendon of back wall of thorax, initial encounter: Secondary | ICD-10-CM

## 2020-08-20 MED ORDER — TIZANIDINE HCL 4 MG PO TABS: 4.0000 mg | ORAL_TABLET | Freq: Four times a day (QID) | ORAL | 1 refills | Status: DC | PRN

## 2020-08-20 NOTE — Patient Instructions (Addendum)
Thank you for coming in today.  Plan for PT.  Ok to use heat and TENS unit.  Try thermacare wraps Use muscle relaxer.   Recheck if not improving.   Let me know if things worsens.    Lumbosacral Strain Lumbosacral strain is an injury that causes pain in the lower back (lumbosacral spine). This injury usually happens from overstretching the muscles or ligaments along your spine. Ligaments are cord-like tissues that connect bones to other bones. A strain can affect one or more muscles or ligaments. What are the causes? This condition may be caused by:  A hard, direct hit to the back.  Overstretching the lower back muscles. This may result from: ? A fall. ? Lifting something heavy. ? Repetitive movements such as bending or crouching. What increases the risk? The following factors may make you more likely to develop this condition:  Participating in sports or activities that involve: ? A sudden twist of the back. ? Pushing or pulling motions.  Being overweight or obese.  Having poor strength and flexibility, especially tight hamstrings or weak muscles in the back or abdomen.  Having too much of a curve in the lower back.  Having a pelvis that is tilted forward. What are the signs or symptoms? The main symptom of this condition is pain in the lower back, at the site of the strain. Pain may also be felt down one or both legs. How is this diagnosed? This condition is diagnosed based on your symptoms, your medical history, and a physical exam. During the physical exam, your health care provider may push on certain areas of your back to find the source of your pain. You may be asked to bend forward, backward, and side to side to check your pain and range of motion. You may also have imaging tests, such as X-rays and an MRI. How is this treated? This condition may be treated by:  Applying heat and cold on the affected area.  Taking medicines to help relieve pain and relax your  muscles.  Taking NSAIDs, such as ibuprofen, to help reduce swelling and discomfort.  Doing stretching and strengthening exercises for your lower back. Symptoms usually improve within several weeks of treatment. However, recovery time varies. When your symptoms improve, gradually return to your normal routine as soon as possible to reduce pain, avoid stiffness, and keep muscle strength. Follow these instructions at home: Medicines  Take over-the-counter and prescription medicines only as told by your health care provider.  Ask your health care provider if the medicine prescribed to you: ? Requires you to avoid driving or using heavy machinery. ? Can cause constipation. You may need to take these actions to prevent or treat constipation:  Drink enough fluid to keep your urine pale yellow.  Take over-the-counter or prescription medicines.  Eat foods that are high in fiber, such as beans, whole grains, and fresh fruits and vegetables.  Limit foods that are high in fat and processed sugars, such as fried or sweet foods. Managing pain, stiffness, and swelling      If directed, put ice on the injured area. To do this: ? Put ice in a plastic bag. ? Place a towel between your skin and the bag. ? Leave the ice on for 20 minutes, 2-3 times a day.  If directed, apply heat on the affected area as often as told by your health care provider. Use the heat source that your health care provider recommends, such as a moist heat pack  or a heating pad. ? Place a towel between your skin and the heat source. ? Leave the heat on for 20-30 minutes. ? Remove the heat if your skin turns bright red. This is especially important if you are unable to feel pain, heat, or cold. You may have a greater risk of getting burned. Activity  Rest as told by your health care provider.  Do not stay in bed. Staying in bed for more than 1-2 days can delay your recovery.  Return to your normal activities as told by  your health care provider. Ask your health care provider what activities are safe for you.  Avoid activities that take a lot of energy for as long as told by your health care provider.  Do exercises as told by your health care provider. This includes stretching and strengthening exercises. General instructions  Sit up and stand up straight. Avoid leaning forward when you sit, or hunching over when you stand.  Do not use any products that contain nicotine or tobacco, such as cigarettes, e-cigarettes, and chewing tobacco. If you need help quitting, ask your health care provider.  Keep all follow-up visits as told by your health care provider. This is important. How is this prevented?   Use correct form when playing sports and lifting heavy objects.  Use good posture when sitting and standing.  Maintain a healthy weight.  Sleep on a mattress with medium firmness to support your back.  Do at least 150 minutes of moderate-intensity exercise each week, such as brisk walking or water aerobics. Try a form of exercise that takes stress off your back, such as swimming or stationary cycling.  Maintain physical fitness, including: ? Strength. ? Flexibility. Contact a health care provider if:  Your back pain does not improve after several weeks of treatment.  Your symptoms get worse. Get help right away if:  Your back pain is severe.  You cannot stand or walk.  You have difficulty controlling when you urinate or when you have a bowel movement.  You feel nauseous or you vomit.  Your feet or legs get very cold, turn pale, or look blue.  You have numbness, tingling, weakness, or problems using your arms or legs.  You develop any of the following: ? Shortness of breath. ? Dizziness. ? Pain in your legs. ? Weakness in your buttocks or legs. Summary  Lumbosacral strain is an injury that causes pain in the lower back (lumbosacral spine).  This injury usually happens from  overstretching the muscles or ligaments along your spine.  This condition may be caused by a direct hit to the lower back or by overstretching the lower back muscles.  Symptoms usually improve within several weeks of treatment. This information is not intended to replace advice given to you by your health care provider. Make sure you discuss any questions you have with your health care provider. Document Revised: 02/08/2019 Document Reviewed: 02/08/2019 Elsevier Patient Education  [employer].

## 2020-09-17 DIAGNOSIS — R42 Dizziness and giddiness: Secondary | ICD-10-CM | POA: Insufficient documentation

## 2020-09-17 DIAGNOSIS — H9192 Unspecified hearing loss, left ear: Secondary | ICD-10-CM | POA: Insufficient documentation

## 2020-09-17 DIAGNOSIS — R0981 Nasal congestion: Secondary | ICD-10-CM | POA: Insufficient documentation

## 2020-09-18 DIAGNOSIS — H903 Sensorineural hearing loss, bilateral: Secondary | ICD-10-CM | POA: Insufficient documentation

## 2020-09-18 DIAGNOSIS — H9202 Otalgia, left ear: Secondary | ICD-10-CM | POA: Insufficient documentation

## 2020-10-08 ENCOUNTER — Other Ambulatory Visit (HOSPITAL_COMMUNITY): Payer: Self-pay | Admitting: Student in an Organized Health Care Education/Training Program

## 2020-10-08 MED FILL — CIPROFLOXACIN-DEXAMETHASONE: 0.3-0.1 | 5 days supply | Qty: 8 | Fill #0

## 2020-10-16 DIAGNOSIS — J343 Hypertrophy of nasal turbinates: Secondary | ICD-10-CM | POA: Insufficient documentation

## 2020-10-16 DIAGNOSIS — J342 Deviated nasal septum: Secondary | ICD-10-CM | POA: Insufficient documentation

## 2020-11-21 DIAGNOSIS — H9072 Mixed conductive and sensorineural hearing loss, unilateral, left ear, with unrestricted hearing on the contralateral side: Secondary | ICD-10-CM | POA: Insufficient documentation

## 2020-12-20 ENCOUNTER — Encounter: Payer: Self-pay | Admitting: Radiology

## 2020-12-20 ENCOUNTER — Ambulatory Visit (INDEPENDENT_AMBULATORY_CARE_PROVIDER_SITE_OTHER): Payer: Medicare Other | Admitting: Cardiovascular Disease

## 2020-12-20 ENCOUNTER — Encounter: Payer: Self-pay | Admitting: Cardiovascular Disease

## 2020-12-20 ENCOUNTER — Other Ambulatory Visit: Payer: Self-pay

## 2020-12-20 ENCOUNTER — Ambulatory Visit (INDEPENDENT_AMBULATORY_CARE_PROVIDER_SITE_OTHER): Payer: Medicare Other

## 2020-12-20 VITALS — BP 130/90 | HR 61 | Ht 67.0 in | Wt 140.4 lb

## 2020-12-20 DIAGNOSIS — R9431 Abnormal electrocardiogram [ECG] [EKG]: Secondary | ICD-10-CM

## 2020-12-20 DIAGNOSIS — R002 Palpitations: Secondary | ICD-10-CM

## 2020-12-20 NOTE — Progress Notes (Signed)
Enrolled patient for a 14 day Zio XT Monitor to be mailed to patients home  

## 2020-12-20 NOTE — Patient Instructions (Addendum)
Medication Instructions:  Your provider recommends that you continue on your current medications as directed. Please refer to the Current Medication list given to you today.   *If you need a refill on your cardiac medications before your next appointment, please call your pharmacy*  Lab Work: TODAY! CBC, BMET, TSH If you have labs (blood work) drawn today and your tests are completely normal, you will receive your results only by: Marland Kitchen MyChart Message (if you have MyChart) OR . A paper copy in the mail If you have any lab test that is abnormal or we need to change your treatment, we will call you to review the results.  Testing/Procedures: Dr. Burt Knack recommends you wear a monitor for 2 weeks. This will be mailed to your home.  Dr. Burt Knack recommends you have a CARDIAC CT. You will be called to arrange this appointment.  Follow-Up: At [facility], you and your health needs are our priority.  As part of our continuing mission to provide you with exceptional heart care, we have created designated Provider Care Teams.  These Care Teams include your primary Cardiologist (physician) and Advanced Practice Providers (APPs -  Physician Assistants and Nurse Practitioners) who all work together to provide you with the care you need, when you need it. Your next appointment:   12 month(s) The format for your next appointment:   In Person Provider:   You may see Sherren Mocha, MD or one of the following Advanced Practice Providers on your designated Care Team:    Richardson Dopp, PA-C  Vin Bhagat, PA-C    [place] Monitor Instructions   Your physician has requested you wear your ZIO patch monitor 14 days.   This is a single patch monitor.  Irhythm supplies one patch monitor per enrollment.  Additional stickers are not available.   Please do not apply patch if you will be having a Nuclear Stress Test, Echocardiogram, Cardiac CT, MRI, or Chest Xray during the time frame you would be wearing  the monitor. The patch cannot be worn during these tests.  You cannot remove and re-apply the ZIO XT patch monitor.   Your ZIO patch monitor will be sent USPS Priority mail from [facility] directly to your home address. The monitor may also be mailed to a PO BOX if home delivery is not available.   It may take 3-5 days to receive your monitor after you have been enrolled.   Once you have received you monitor, please review enclosed instructions.  Your monitor has already been registered assigning a specific monitor serial # to you.   Applying the monitor   Shave hair from upper left chest.   Hold abrader disc by orange tab.  Rub abrader in 40 strokes over left upper chest as indicated in your monitor instructions.   Clean area with 4 enclosed alcohol pads .  Use all pads to assure are is cleaned thoroughly.  Let dry.   Apply patch as indicated in monitor instructions.  Patch will be place under collarbone on left side of chest with arrow pointing upward.   Rub patch adhesive wings for 2 minutes.Remove white label marked "1".  Remove white label marked "2".  Rub patch adhesive wings for 2 additional minutes.   While looking in a mirror, press and release button in center of patch.  A small green light will flash 3-4 times .  This will be your only indicator the monitor has been turned on.  Do not shower for the first 24 hours.  You may shower after the first 24 hours.   Press button if you feel a symptom. You will hear a small click.  Record Date, Time and Symptom in the Patient Log Book.   When you are ready to remove patch, follow instructions on last 2 pages of Patient Log Book.  Stick patch monitor onto last page of Patient Log Book.   Place Patient Log Book in [place] box.  Use locking tab on box and tape box closed securely.  The [employer] and [employer] has [employer] on it.  Please place in mailbox as soon as possible.  Your physician should have your test results  approximately 7 days after the monitor has been mailed back to [facility].   Call [place] at [phone] if you have questions regarding your ZIO XT patch monitor.  Call them immediately if you see an orange light blinking on your monitor.   If your monitor falls off in less than 4 days contact our Monitor department at [phone].  If your monitor becomes loose or falls off after 4 days call [employer] at [phone] for suggestions on securing your monitor.    CARDIAC CT INFORMATION: Your cardiac CT will be scheduled at [facility]: [place], [place] [address] [phone]  Please arrive at the [facility] main entrance (entrance A) of [facility] 30 minutes prior to test start time. Proceed to the [facility] Radiology Department (first floor) to check-in and test prep.  Please follow these instructions carefully (unless otherwise directed):  Hold all erectile dysfunction medications at least 3 days (72 hrs) prior to test.  On the Night Before the Test: . Be sure to Drink plenty of water. . Do not consume any caffeinated/decaffeinated beverages or chocolate 12 hours prior to your test. . Do not take any antihistamines 12 hours prior to your test.  On the Day of the Test: . Drink plenty of water until 1 hour prior to the test. . Do not eat any food 4 hours prior to the test. . You may take your regular medications prior to the test.       After the Test: . Drink plenty of water. . After receiving IV contrast, you may experience a mild flushed feeling. This is normal. . On occasion, you may experience a mild rash up to 24 hours after the test. This is not dangerous. If this occurs, you can take Benadryl 25 mg and increase your fluid intake. . If you experience trouble breathing, this can be serious. If it is severe call 911 IMMEDIATELY. If it is mild, please call our office.  Once we have confirmed authorization from  your insurance company, we will call you to set up a date and time for your test. Based on how quickly your insurance processes prior authorizations requests, please allow up to 4 weeks to be contacted for scheduling your Cardiac CT appointment. Be advised that routine Cardiac CT appointments could be scheduled as many as 8 weeks after your provider has ordered it.  For non-scheduling related questions, please contact the cardiac imaging nurse navigator should you have any questions/concerns: Marchia Bond, Cardiac Imaging Nurse Navigator Gordy Clement, Cardiac Imaging Nurse Navigator [facility] Direct Office Dial: [phone]   For scheduling needs, including cancellations and rescheduling, please call [place], [phone].

## 2020-12-20 NOTE — Progress Notes (Signed)
Cardiology Office Note:    Date:  12/20/2020   ID:  Karl [name], DOB [birth year], MRN [id number]  PCP:  Karl Shanks, MD   Karl [name]  Cardiologist:  Karl Mocha, MD  Advanced Practice Provider:  No care team member to display Electrophysiologist:  None    Referring MD: Karl Shanks, MD   Chief Complaint  Patient presents with  . Palpitations    History of Present Illness:    Karl [name] is a 65 y.o. male with a hx of heart palpitations and mixed hyperlipidemia, presenting for evaluation of worsening palpitations.  He was last seen here in 2015.  He still works 1 day/week in the [employer] at [employer].  Karl [name] is having more frequent palpitations since one week ago. He has noticed an increase in his resting heart rate has been elevated in the high 70's. He has had a feeling of skipped beats that feel like PVC's. His normal heart rate is about 60 bpm. He had an EKG that showed normal sinus rhythm 80 bpm with ST-T changes consider anterolateral/inferior ischemia. He has not had any exertional chest pain or pressure. No dyspnea with exertion. No edema, orthopnea, or PND.   Past Medical History:  Diagnosis Date  . Colon polyps   . Hypercholesteremia   . Prostate cancer ([place]) 04/2016    Past Surgical History:  Procedure Laterality Date  . PROSTATECTOMY  04/2016    Current Medications: Current Meds  Medication Sig  . aspirin 81 MG tablet Take 81 mg by mouth daily.  . colestipol (COLESTID) 1 g tablet TAKE 1 TABLET BY MOUTH TWICE A DAY  . Cyanocobalamin (B-12) 1000 MCG CAPS   . ezetimibe-simvastatin (VYTORIN) 10-40 MG tablet Take 1 tablet by mouth at bedtime.  . fluticasone (FLONASE) 50 MCG/ACT nasal spray Place into the nose as needed.     Allergies:   Penicillins   Social History   Socioeconomic History  . Marital status: Married    Spouse name: Not on file  . Number of children: 0  . Years of education: Not on file  . Highest  education level: Not on file  Occupational History  . Occupation: Therapist, sports  Tobacco Use  . Smoking status: Never Smoker  . Smokeless tobacco: Never Used  Vaping Use  . Vaping Use: Never used  Substance and Sexual Activity  . Alcohol use: No  . Drug use: No  . Sexual activity: Not on file  Other Topics Concern  . Not on file  Social History Narrative  . Not on file   Social Determinants of Health   Financial Resource Strain: Not on file  Food Insecurity: Not on file  Transportation Needs: Not on file  Physical Activity: Not on file  Stress: Not on file  Social Connections: Not on file     Family History: The patient's family history includes Heart attack in his mother.  ROS:   Please see the history of present illness.    All other systems reviewed and are negative.  EKGs/Labs/Other Studies Reviewed:    EKG:  EKG is not ordered today.  EKG dated 12/18/2020 shows normal sinus rhythm with anterolateral ST and T abnormality as well as inferior T wave abnormality suggestive of anterolateral and inferior ischemia  Recent Labs: No results found for requested labs within last 8760 hours.  Recent Lipid Panel No results found for: CHOL, TRIG, HDL, CHOLHDL, VLDL, LDLCALC, LDLDIRECT   Risk Assessment/Calculations:  Physical Exam:    VS:  BP 130/90   Pulse 61   Ht 5\' 7"  (1.702 m)   Wt 140 lb 6.4 oz (63.7 kg)   SpO2 96%   BMI 21.99 kg/m     Wt Readings from Last 3 Encounters:  12/20/20 140 lb 6.4 oz (63.7 kg)  08/20/20 145 lb 3.2 oz (65.9 kg)  05/31/20 140 lb (63.5 kg)     GEN:  Well nourished, well developed in no acute distress HEENT: Normal NECK: No JVD; No carotid bruits LYMPHATICS: No lymphadenopathy CARDIAC: RRR, no murmurs, rubs, gallops RESPIRATORY:  Clear to auscultation without rales, wheezing or rhonchi  ABDOMEN: Soft, non-tender, non-distended MUSCULOSKELETAL:  No edema; No deformity  SKIN: Warm and dry NEUROLOGIC:  Alert and oriented x  3 PSYCHIATRIC:  Normal affect   ASSESSMENT:    1. EKG abnormalities   2. Palpitations   3. Abnormal electrocardiogram    PLAN:    In order of problems listed above:  1. The patient has ST and T wave abnormalities concerning for ischemic heart disease.  He has no angina, but does have a family history of coronary artery disease.  His father had a myocardial infarction at age 46.  I have recommended a gated coronary CTA for further evaluation. 2. With his increase in heart rate, we will check some basic labs to evaluate for anemia or thyroid abnormalities.  We will add a metabolic panel.  We will check a ZIO monitor, 14-day.  We will touch base with him when his monitor and lab work is resulted.  Medication Adjustments/Labs and Tests Ordered: Current medicines are reviewed at length with the patient today.  Concerns regarding medicines are outlined above.  Orders Placed This Encounter  Procedures  . CT CORONARY MORPH W/CTA COR W/SCORE W/CA W/CM &/OR WO/CM  . CBC with Differential/Platelet  . Basic metabolic panel  . TSH  . LONG TERM MONITOR (3-14 DAYS)  . EKG 12-Lead   No orders of the defined types were placed in this encounter.   Patient Instructions  Medication Instructions:  Your provider recommends that you continue on your current medications as directed. Please refer to the Current Medication list given to you today.   *If you need a refill on your cardiac medications before your next appointment, please call your pharmacy*  Lab Work: TODAY! CBC, BMET, TSH If you have labs (blood work) drawn today and your tests are completely normal, you will receive your results only by: Marland Kitchen MyChart Message (if you have MyChart) OR . A paper copy in the mail If you have any lab test that is abnormal or we need to change your treatment, we will call you to review the results.  Testing/Procedures: Dr. Burt Knack recommends you wear a monitor for 2 weeks. This will be mailed to your  home.  Dr. Burt Knack recommends you have a CARDIAC CT. You will be called to arrange this appointment.  Follow-Up: At [facility], you and your health needs are our priority.  As part of our continuing mission to provide you with exceptional heart care, we have created designated Provider Care Teams.  These Care Teams include your primary Cardiologist (physician) and Advanced Practice Providers (APPs -  Physician Assistants and Nurse Practitioners) who all work together to provide you with the care you need, when you need it. Your next appointment:   12 month(s) The format for your next appointment:   In Person Provider:   You may see Karl Mocha,  MD or one of the following Advanced Practice Providers on your designated Care Team:    Richardson Dopp, PA-C  Vin Bhagat, PA-C    [place] Monitor Instructions   Your physician has requested you wear your ZIO patch monitor 14 days.   This is a single patch monitor.  [employer] supplies one patch monitor per enrollment.  Additional stickers are not available.   Please do not apply patch if you will be having a Nuclear Stress Test, Echocardiogram, Cardiac CT, MRI, or Chest Xray during the time frame you would be wearing the monitor. The patch cannot be worn during these tests.  You cannot remove and re-apply the ZIO XT patch monitor.   Your ZIO patch monitor will be sent USPS Priority mail from [facility] directly to your home address. The monitor may also be mailed to a PO BOX if home delivery is not available.   It may take 3-5 days to receive your monitor after you have been enrolled.   Once you have received you monitor, please review enclosed instructions.  Your monitor has already been registered assigning a specific monitor serial # to you.   Applying the monitor   Shave hair from upper left chest.   Hold abrader disc by orange tab.  Rub abrader in 40 strokes over left upper chest as indicated in your monitor  instructions.   Clean area with 4 enclosed alcohol pads .  Use all pads to assure are is cleaned thoroughly.  Let dry.   Apply patch as indicated in monitor instructions.  Patch will be place under collarbone on left side of chest with arrow pointing upward.   Rub patch adhesive wings for 2 minutes.Remove white label marked "1".  Remove white label marked "2".  Rub patch adhesive wings for 2 additional minutes.   While looking in a mirror, press and release button in center of patch.  A small green light will flash 3-4 times .  This will be your only indicator the monitor has been turned on.     Do not shower for the first 24 hours.  You may shower after the first 24 hours.   Press button if you feel a symptom. You will hear a small click.  Record Date, Time and Symptom in the Patient Log Book.   When you are ready to remove patch, follow instructions on last 2 pages of Patient Log Book.  Stick patch monitor onto last page of Patient Log Book.   Place Patient Log Book in [place] box.  Use locking tab on box and tape box closed securely.  The [employer] and [employer] has [employer] on it.  Please place in mailbox as soon as possible.  Your physician should have your test results approximately 7 days after the monitor has been mailed back to [facility].   Call [place] at [phone] if you have questions regarding your ZIO XT patch monitor.  Call them immediately if you see an orange light blinking on your monitor.   If your monitor falls off in less than 4 days contact our Monitor department at [phone].  If your monitor becomes loose or falls off after 4 days call [employer] at [phone] for suggestions on securing your monitor.    CARDIAC CT INFORMATION: Your cardiac CT will be scheduled at [facility]: [place], [place] [address] [phone]  Please arrive at the [facility] main entrance (entrance A) of [place]  [facility] 30  minutes prior to test start time. Proceed to the [facility] Radiology Department (first floor) to check-in and test prep.  Please follow these instructions carefully (unless otherwise directed):  Hold all erectile dysfunction medications at least 3 days (72 hrs) prior to test.  On the Night Before the Test: . Be sure to Drink plenty of water. . Do not consume any caffeinated/decaffeinated beverages or chocolate 12 hours prior to your test. . Do not take any antihistamines 12 hours prior to your test.  On the Day of the Test: . Drink plenty of water until 1 hour prior to the test. . Do not eat any food 4 hours prior to the test. . You may take your regular medications prior to the test.       After the Test: . Drink plenty of water. . After receiving IV contrast, you may experience a mild flushed feeling. This is normal. . On occasion, you may experience a mild rash up to 24 hours after the test. This is not dangerous. If this occurs, you can take Benadryl 25 mg and increase your fluid intake. . If you experience trouble breathing, this can be serious. If it is severe call 911 IMMEDIATELY. If it is mild, please call our office.  Once we have confirmed authorization from your insurance company, we will call you to set up a date and time for your test. Based on how quickly your insurance processes prior authorizations requests, please allow up to 4 weeks to be contacted for scheduling your Cardiac CT appointment. Be advised that routine Cardiac CT appointments could be scheduled as many as 8 weeks after your provider has ordered it.  For non-scheduling related questions, please contact the cardiac imaging nurse navigator should you have any questions/concerns: Marchia Bond, Cardiac Imaging Nurse Navigator Gordy Clement, Cardiac Imaging Nurse Navigator [place] [facility] Direct Office Dial: [phone]   For scheduling needs, including cancellations and rescheduling,  please call [place], [phone].    Signed, Karl Mocha, MD  12/20/2020 1:50 PM    [facility]

## 2020-12-21 LAB — CBC WITH DIFFERENTIAL/PLATELET
Basophils Absolute: 0.1 10*3/uL (ref 0.0–0.2)
Basos: 1 %
EOS (ABSOLUTE): 0.3 10*3/uL (ref 0.0–0.4)
Eos: 6 %
Hematocrit: 41.7 % (ref 37.5–51.0)
Hemoglobin: 14.3 g/dL (ref 13.0–17.7)
Immature Grans (Abs): 0 10*3/uL (ref 0.0–0.1)
Immature Granulocytes: 0 %
Lymphocytes Absolute: 1.5 10*3/uL (ref 0.7–3.1)
Lymphs: 29 %
MCH: 30.9 pg (ref 26.6–33.0)
MCHC: 34.3 g/dL (ref 31.5–35.7)
MCV: 90 fL (ref 79–97)
Monocytes Absolute: 0.3 10*3/uL (ref 0.1–0.9)
Monocytes: 6 %
Neutrophils Absolute: 3 10*3/uL (ref 1.4–7.0)
Neutrophils: 58 %
Platelets: 239 10*3/uL (ref 150–450)
RBC: 4.63 x10E6/uL (ref 4.14–5.80)
RDW: 12.5 % (ref 11.6–15.4)
WBC: 5.1 10*3/uL (ref 3.4–10.8)

## 2020-12-21 LAB — BASIC METABOLIC PANEL
BUN/Creatinine Ratio: 11 (ref 10–24)
BUN: 9 mg/dL (ref 8–27)
CO2: 23 mmol/L (ref 20–29)
Calcium: 9.4 mg/dL (ref 8.6–10.2)
Chloride: 101 mmol/L (ref 96–106)
Creatinine, Ser: 0.83 mg/dL (ref 0.76–1.27)
Glucose: 90 mg/dL (ref 65–99)
Potassium: 4.8 mmol/L (ref 3.5–5.2)
Sodium: 139 mmol/L (ref 134–144)
eGFR: 97 mL/min/{1.73_m2} (ref >59)

## 2020-12-21 LAB — TSH: TSH: 1.57 u[IU]/mL (ref 0.450–4.500)

## 2020-12-26 ENCOUNTER — Telehealth (HOSPITAL_COMMUNITY): Payer: Self-pay | Admitting: Emergency Medicine

## 2020-12-26 NOTE — Telephone Encounter (Signed)
Reaching out to patient to offer assistance regarding upcoming cardiac imaging study; pt verbalizes understanding of appt date/time, parking situation and where to check in, pre-test NPO status and medications ordered, and verified current allergies; name and call back number provided for further questions should they arise Marchia Bond RN Navigator Cardiac Imaging [facility] [phone] office [phone] cell   Pt will apply ZIO monitor after CTA Clarise Cruz

## 2020-12-27 ENCOUNTER — Ambulatory Visit (HOSPITAL_COMMUNITY)
Admission: RE | Admit: 2020-12-27 | Discharge: 2020-12-27 | Disposition: A | Discharge status: Home / Self Care | Payer: Medicare Other | Source: Ambulatory Visit | Attending: Cardiovascular Disease | Admitting: Cardiovascular Disease

## 2020-12-27 ENCOUNTER — Other Ambulatory Visit: Payer: Self-pay

## 2020-12-27 DIAGNOSIS — R9431 Abnormal electrocardiogram [ECG] [EKG]: Secondary | ICD-10-CM

## 2020-12-27 DIAGNOSIS — R002 Palpitations: Secondary | ICD-10-CM | POA: Diagnosis present

## 2020-12-27 MED ORDER — NITROGLYCERIN 0.4 MG SL SUBL
SUBLINGUAL_TABLET | SUBLINGUAL | Status: AC
  Administered 2020-12-27: 0.8 mg via SUBLINGUAL
  Filled 2020-12-27: qty 2

## 2020-12-27 MED ORDER — NITROGLYCERIN 0.4 MG SL SUBL: 0.8000 mg | SUBLINGUAL_TABLET | Freq: Once | SUBLINGUAL | Status: AC | PRN

## 2020-12-27 MED ORDER — IOHEXOL 350 MG/ML SOLN
80.0000 mL | Freq: Once | INTRAVENOUS | Status: AC | PRN
  Administered 2020-12-27: 80 mL via INTRAVENOUS

## 2021-01-16 ENCOUNTER — Other Ambulatory Visit: Payer: Self-pay | Admitting: Gastroenterology

## 2021-06-10 ENCOUNTER — Institutional Professional Consult (permissible substitution): Payer: Medicare Other | Admitting: Pulmonary Disease

## 2021-07-24 ENCOUNTER — Other Ambulatory Visit: Payer: Self-pay

## 2021-07-24 ENCOUNTER — Institutional Professional Consult (permissible substitution): Payer: Medicare Other | Admitting: Pulmonary Disease

## 2021-07-24 ENCOUNTER — Ambulatory Visit (INDEPENDENT_AMBULATORY_CARE_PROVIDER_SITE_OTHER): Payer: Medicare Other | Admitting: Pulmonary Disease

## 2021-07-24 ENCOUNTER — Encounter: Payer: Self-pay | Admitting: Pulmonary Disease

## 2021-07-24 VITALS — BP 114/72 | HR 73 | Temp 97.9°F | Ht 68.0 in | Wt 145.2 lb

## 2021-07-24 DIAGNOSIS — R918 Other nonspecific abnormal finding of lung field: Secondary | ICD-10-CM | POA: Diagnosis not present

## 2021-07-24 NOTE — Progress Notes (Signed)
Subjective:   PATIENT ID: Karl [name] GENDER: male DOB: 08-Mar-1956, MRN: [id number]   HPI  Chief Complaint  Patient presents with   Consult    Had a ct and found lung nodules    Reason for Visit: New consult for lung nodules  [name] is a 65 year old male never smoker with hx prostate cancer and HLD who presents as a new consult.  He had a CT coronary scan in March 2022 which demonstrated two solid left lower lobe nodules measuring <56mm nodules. With his history of prostate cancer, he is seeking advice regarding monitoring of his lung nodules. At this time, he denies any recent unexplained fevers/chills, denies unintentional weight loss, denies night sweats. No respiratory symptoms, denies cough/shortness of breath/wheezing.  He grew up in a house with a wood burning stove. He has worked in health care. Does change car brakes every four years. Otherwise no environmental exposures.  I have personally reviewed patient's past medical/family/social history, allergies, current medications.  Past Medical History:  Diagnosis Date   Colon polyps    Hypercholesteremia    Prostate cancer ([place]) 04/2016     Family History  Problem Relation Age of Onset   Heart attack Mother      Social History   Occupational History   Occupation: Therapist, sports  Tobacco Use   Smoking status: Never   Smokeless tobacco: Never  Vaping Use   Vaping Use: Never used  Substance and Sexual Activity   Alcohol use: No   Drug use: No   Sexual activity: Not on file    Allergies  Allergen Reactions   Penicillins Rash     Outpatient Medications Prior to Visit  Medication Sig Dispense Refill   aspirin 81 MG tablet Take 81 mg by mouth daily.     ciprofloxacin-dexamethasone (CIPRODEX) OTIC suspension PLACE 4 DROPS INTO THE LEFT EAR 2 TIMES DAILY FOR 5 DAYS. 7.5 mL 1   colestipol (COLESTID) 1 g tablet TAKE 1 TABLET BY MOUTH TWICE A DAY 180 tablet 1   Cyanocobalamin (B-12) 1000 MCG CAPS       ezetimibe-simvastatin (VYTORIN) 10-40 MG tablet Take 1 tablet by mouth at bedtime.     fluticasone (FLONASE) 50 MCG/ACT nasal spray Place into the nose as needed.     No facility-administered medications prior to visit.    Review of Systems  Constitutional:  Negative for chills, diaphoresis, fever, malaise/fatigue and weight loss.  HENT:  Negative for congestion, ear pain and sore throat.   Respiratory:  Negative for cough, hemoptysis, sputum production, shortness of breath and wheezing.   Cardiovascular:  Negative for chest pain, palpitations and leg swelling.  Gastrointestinal:  Negative for abdominal pain, heartburn and nausea.  Genitourinary:  Negative for frequency.  Musculoskeletal:  Negative for joint pain and myalgias.  Skin:  Negative for itching and rash.  Neurological:  Negative for dizziness, weakness and headaches.  Endo/Heme/Allergies:  Does not bruise/bleed easily.  Psychiatric/Behavioral:  Negative for depression. The patient is not nervous/anxious.     Objective:   Vitals:   07/24/21 1137  BP: 114/72  Pulse: 73  Temp: 97.9 F (36.6 C)  SpO2: 99%  Weight: 145 lb 3.2 oz (65.9 kg)  Height: 5\' 8"  (1.727 m)   SpO2: 99 % O2 Device: None (Room air)  Physical Exam: General: Well-appearing, no acute distress HENT: [place], AT Eyes: EOMI, no scleral icterus Respiratory: Clear to auscultation bilaterally.  No crackles, wheezing or rales Cardiovascular: RRR, -M/R/G, no  JVD Extremities:-Edema,-tenderness Neuro: AAO x4, CNII-XII grossly intact Psych: Normal mood, normal affect  Data Reviewed:  Imaging: CT Coronary 12/27/20 - two left lower lobe lung nodules with largest 53mm  PFT: None on file  Labs: CBC    Component Value Date/Time   WBC 5.1 12/20/2020 1208   WBC 6.0 10/21/2010 1255   RBC 4.63 12/20/2020 1208   RBC 4.72 10/21/2010 1255   HGB 14.3 12/20/2020 1208   HCT 41.7 12/20/2020 1208   PLT 239 12/20/2020 1208   MCV 90 12/20/2020 1208   MCH 30.9  12/20/2020 1208   MCH 30.3 10/21/2010 1255   MCHC 34.3 12/20/2020 1208   MCHC 34.5 10/21/2010 1255   RDW 12.5 12/20/2020 1208   LYMPHSABS 1.5 12/20/2020 1208   MONOABS 0.4 10/21/2010 1255   EOSABS 0.3 12/20/2020 1208   BASOSABS 0.1 12/20/2020 1208   Absolute eso 12/20/20 - 300    Assessment & Plan:   Discussion: 65 year old male never smoker with subcentimeter solid nodules. We reviewed risk profile for lung nodules which is low. After discussion regarding his personal history of malignancy, will plan for CT chest in one month.  Lung nodules <6 mm in left lung ORDER CT chest without contrast in 11/2021  Follow-up with me in 5 months  Health Maintenance Immunization History  Administered Date(s) Administered   Influenza, High Dose Seasonal PF 07/16/2021   Influenza,inj,Quad PF,6+ Mos 06/27/2018, 06/04/2019   Influenza-Unspecified 07/11/2017   PFIZER(Purple Top)SARS-COV-2 Vaccination 09/24/2019, 10/14/2019   Zoster Recombinat (Shingrix) 07/08/2018   CT Lung Screen - not indicated. Never smoker  Orders Placed This Encounter  Procedures   CT Chest Wo Contrast    Standing Status:   Future    Standing Expiration Date:   07/24/2022    Scheduling Instructions:     Please schedule in march 2023    Order Specific Question:   Preferred imaging location?    Answer:   [place]  No orders of the defined types were placed in this encounter.  Return in about 5 months (around 12/22/2021).  I have spent a total time of 35-minutes on the day of the appointment reviewing prior documentation, coordinating care and discussing medical diagnosis and plan with the patient/family. Imaging, labs and tests included in this note have been reviewed and interpreted independently by me.  Hall, MD [facility] 07/24/2021 11:58 AM  Office Number [phone]

## 2021-07-24 NOTE — Patient Instructions (Addendum)
Lung nodules <6 mm in left lung ORDER CT chest without contrast in 11/2021  Follow-up with me in March

## 2021-09-16 ENCOUNTER — Telehealth: Payer: Self-pay | Admitting: Pulmonary Disease

## 2021-10-10 NOTE — Telephone Encounter (Signed)
I will schedule appt  And follow up with pt's spouse Cr 10/10/21

## 2021-11-22 NOTE — Telephone Encounter (Signed)
CT scheduled for 3/3 and f/u for 3/15.  Nothing further needed.

## 2021-11-29 ENCOUNTER — Ambulatory Visit
Admission: RE | Admit: 2021-11-29 | Discharge: 2021-11-29 | Disposition: A | Discharge status: Home / Self Care | Payer: Medicare Other | Source: Ambulatory Visit | Attending: Pulmonary Disease | Admitting: Pulmonary Disease

## 2021-11-29 DIAGNOSIS — R918 Other nonspecific abnormal finding of lung field: Secondary | ICD-10-CM | POA: Diagnosis not present

## 2021-11-29 DIAGNOSIS — R911 Solitary pulmonary nodule: Secondary | ICD-10-CM | POA: Diagnosis not present

## 2021-12-02 ENCOUNTER — Encounter: Payer: Self-pay | Admitting: Physician Assistant

## 2021-12-05 ENCOUNTER — Ambulatory Visit: Payer: Medicare Other | Admitting: Pulmonary Disease

## 2021-12-11 ENCOUNTER — Encounter: Payer: Self-pay | Admitting: Pulmonary Disease

## 2021-12-11 ENCOUNTER — Ambulatory Visit: Payer: Medicare Other | Admitting: Pulmonary Disease

## 2021-12-11 ENCOUNTER — Other Ambulatory Visit: Payer: Self-pay

## 2021-12-11 ENCOUNTER — Encounter: Payer: Self-pay | Admitting: Physician Assistant

## 2021-12-11 ENCOUNTER — Ambulatory Visit (INDEPENDENT_AMBULATORY_CARE_PROVIDER_SITE_OTHER): Payer: Medicare Other | Admitting: Physician Assistant

## 2021-12-11 ENCOUNTER — Ambulatory Visit (INDEPENDENT_AMBULATORY_CARE_PROVIDER_SITE_OTHER): Payer: Medicare Other | Admitting: Pulmonary Disease

## 2021-12-11 ENCOUNTER — Other Ambulatory Visit (INDEPENDENT_AMBULATORY_CARE_PROVIDER_SITE_OTHER): Payer: Medicare Other

## 2021-12-11 VITALS — BP 120/68 | HR 70 | Temp 97.9°F | Ht 67.0 in | Wt 147.2 lb

## 2021-12-11 VITALS — BP 130/80 | HR 72 | Ht 67.0 in | Wt 148.0 lb

## 2021-12-11 DIAGNOSIS — R932 Abnormal findings on diagnostic imaging of liver and biliary tract: Secondary | ICD-10-CM

## 2021-12-11 DIAGNOSIS — R918 Other nonspecific abnormal finding of lung field: Secondary | ICD-10-CM | POA: Diagnosis not present

## 2021-12-11 DIAGNOSIS — K529 Noninfective gastroenteritis and colitis, unspecified: Secondary | ICD-10-CM

## 2021-12-11 LAB — COMPREHENSIVE METABOLIC PANEL
ALT: 23 U/L (ref 0–53)
AST: 22 U/L (ref 0–37)
Albumin: 4.8 g/dL (ref 3.5–5.2)
Alkaline Phosphatase: 36 U/L — ABNORMAL LOW (ref 39–117)
BUN: 11 mg/dL (ref 6–23)
CO2: 29 mEq/L (ref 19–32)
Calcium: 9.4 mg/dL (ref 8.4–10.5)
Chloride: 102 mEq/L (ref 96–112)
Creatinine, Ser: 0.93 mg/dL (ref 0.40–1.50)
GFR: 85.33 mL/min (ref >60.00)
Glucose, Bld: 80 mg/dL (ref 70–99)
Potassium: 4.6 mEq/L (ref 3.5–5.1)
Sodium: 137 mEq/L (ref 135–145)
Total Bilirubin: 1 mg/dL (ref 0.2–1.2)
Total Protein: 7.1 g/dL (ref 6.0–8.3)

## 2021-12-11 LAB — CBC WITH DIFFERENTIAL/PLATELET
Basophils Absolute: 0.1 10*3/uL (ref 0.0–0.1)
Basophils Relative: 1.2 % (ref 0.0–3.0)
Eosinophils Absolute: 0.3 10*3/uL (ref 0.0–0.7)
Eosinophils Relative: 5.9 % — ABNORMAL HIGH (ref 0.0–5.0)
HCT: 43.3 % (ref 39.0–52.0)
Hemoglobin: 14.5 g/dL (ref 13.0–17.0)
Lymphocytes Relative: 29 % (ref 12.0–46.0)
Lymphs Abs: 1.6 10*3/uL (ref 0.7–4.0)
MCHC: 33.5 g/dL (ref 30.0–36.0)
MCV: 91.3 fl (ref 78.0–100.0)
Monocytes Absolute: 0.4 10*3/uL (ref 0.1–1.0)
Monocytes Relative: 6.5 % (ref 3.0–12.0)
Neutro Abs: 3.1 10*3/uL (ref 1.4–7.7)
Neutrophils Relative %: 57.4 % (ref 43.0–77.0)
Platelets: 246 10*3/uL (ref 150.0–400.0)
RBC: 4.75 Mil/uL (ref 4.22–5.81)
RDW: 13.7 % (ref 11.5–15.5)
WBC: 5.4 10*3/uL (ref 4.0–10.5)

## 2021-12-11 MED ORDER — COLESTIPOL HCL 1 G PO TABS: 1.0000 g | ORAL_TABLET | Freq: Two times a day (BID) | ORAL | 11 refills | Status: DC

## 2021-12-11 NOTE — Progress Notes (Signed)
? ? ?Subjective:  ? ?PATIENT ID: Karl [name] GENDER: male DOB: 04/06/1956, MRN: [id number] ? ? ?HPI ? ?Chief Complaint  ?Patient presents with  ? Follow-up  ?  Follow up on CT scan, no concerns  ? ? ?Reason for Visit: Follow-up ? ?Karl [name] is a 66 year old male never smoker with hx prostate cancer and HLD who presents for follow-up ? ?Synopsis: ?He had a CT coronary scan in March 2022 which demonstrated two solid left lower lobe nodules measuring <3m nodules. With his history of prostate cancer, he is seeking advice regarding monitoring of his lung nodules. At this time, he denies any recent unexplained fevers/chills, denies unintentional weight loss, denies night sweats. No respiratory symptoms, denies cough/shortness of breath/wheezing. He grew up in a house with a wood burning stove. He has worked in health care. Does change car brakes every four years. Otherwise no environmental exposures. ? ?12/11/21 ?He presents for follow-up for his recent CT scan. ?Denies recent unexplained fevers, chills, unintentional weight loss, night sweats.  No respiratory symptoms.  Denies cough, shortness of breath, wheezing. ? ?Past Medical History:  ?Diagnosis Date  ? Colon polyps   ? Hypercholesteremia   ? [place] 04[place]  ?  ? ?Family History  ?Problem Relation Age of Onset  ? Heart attack Mother   ?  ? ?Social History  ? ?Occupational History  ? Occupation: RTherapist, sports ?Tobacco Use  ? Smoking status: Never  ? Smokeless tobacco: Never  ?Vaping Use  ? Vaping Use: Never used  ?Substance and Sexual Activity  ? Alcohol use: No  ? Drug use: No  ? Sexual activity: Not on file  ? ? ?Allergies  ?Allergen Reactions  ? Penicillins Rash  ?  ? ?Outpatient Medications Prior to Visit  ?Medication Sig Dispense Refill  ? aspirin 81 MG tablet Take 81 mg by mouth daily.    ? colestipol (COLESTID) 1 g tablet Take 1 tablet (1 g total) by mouth 2 (two) times daily. 60 tablet 11  ? Cyanocobalamin (B-12) 1000 MCG CAPS     ?  ezetimibe-simvastatin (VYTORIN) 10-40 MG tablet Take 1 tablet by mouth at bedtime.    ? fluticasone (FLONASE) 50 MCG/ACT nasal spray Place into the nose as needed.    ? ?No facility-administered medications prior to visit.  ? ? ?Review of Systems  ?Constitutional:  Negative for chills, diaphoresis, fever, malaise/fatigue and weight loss.  ?HENT:  Negative for congestion.   ?Respiratory:  Negative for cough, hemoptysis, sputum production, shortness of breath and wheezing.   ?Cardiovascular:  Negative for chest pain, palpitations and leg swelling.  ? ? ?Objective:  ? ?Vitals:  ? 12/11/21 1518  ?BP: 120/68  ?Pulse: 70  ?Temp: 97.9 ?F (36.6 ?C)  ?TempSrc: Oral  ?SpO2: 98%  ?Weight: 147 lb 3.2 oz (66.8 kg)  ?Height: '5\' 7"'$  (1.702 m)  ? ?SpO2: 98 % ?O2 Device: None (Room air) ? ?Physical Exam: ?General: Well-appearing, no acute distress ?HENT: [place], AT ?Eyes: EOMI, no scleral icterus ?Respiratory: Clear to auscultation bilaterally.  No crackles, wheezing or rales ?Cardiovascular: RRR, -M/R/G, no JVD ?Extremities:-Edema,-tenderness ?Neuro: AAO x4, CNII-XII grossly intact ?Psych: Normal mood, normal affect ? ?Data Reviewed: ? ?Imaging: ?CT Coronary 12/27/20 - two left lower lobe lung nodules with largest 340m?CT chest 11/29/2021-unchanged left lower lobe pulmonary nodules measured 3 x 2 mm and 4 x 2 mm.  Right apical nodule measured 2 mm, previously outside of the view CT coronary. ? ?PFT: ?None on  file ? ?Labs: ?CBC ?   ?Component Value Date/Time  ? WBC 5.4 12/11/2021 1121  ? RBC 4.75 12/11/2021 1121  ? HGB 14.5 12/11/2021 1121  ? HGB 14.3 12/20/2020 1208  ? HCT 43.3 12/11/2021 1121  ? HCT 41.7 12/20/2020 1208  ? PLT 246.0 12/11/2021 1121  ? PLT 239 12/20/2020 1208  ? MCV 91.3 12/11/2021 1121  ? MCV 90 12/20/2020 1208  ? MCH 30.9 12/20/2020 1208  ? MCH 30.3 10/21/2010 1255  ? MCHC 33.5 12/11/2021 1121  ? RDW 13.7 12/11/2021 1121  ? RDW 12.5 12/20/2020 1208  ? LYMPHSABS 1.6 12/11/2021 1121  ? LYMPHSABS 1.5 12/20/2020 1208  ?  MONOABS 0.4 12/11/2021 1121  ? EOSABS 0.3 12/11/2021 1121  ? EOSABS 0.3 12/20/2020 1208  ? BASOSABS 0.1 12/11/2021 1121  ? BASOSABS 0.1 12/20/2020 1208  ? ?Absolute eso 12/20/20 - 300 ?   ?Assessment & Plan:  ? ?Discussion: ?66 year old male never smoker with subcentimeter solid nodules.  His risk profile for primary lung malignancy is low. According to recent Fleischner Society pulmonary nodule guidelines, multiple solid nodules less than 6 mm recommend optional CT at 12 months in high risk patients.  CT scan demonstrates stable left lower lobe pulm nodules with largest measuring 3 mm.  He does have a right upper lobe nodule previously not seen measuring 2 mm.   ? ?Pulmonary nodules are likely benign however after discussing his personal history of prostate cancer, will order repeat CT in 1 year to ensure stability.  If this is stable no further surveillance imaging recommended ? ?Multiple subcentimeter nodules ?ORDER CT Chest without contrast in 11/2022 ? ?Health Maintenance ?Immunization History  ?Administered Date(s) Administered  ? Influenza Inj Mdck Quad Pf 08/01/2020  ? Influenza Split 06/27/2015, 08/02/2020  ? Influenza, High Dose Seasonal PF 07/25/2021  ? Influenza,inj,Quad PF,6+ Mos 06/27/2018, 06/04/2019  ? Influenza-Unspecified 07/11/2017  ? PFIZER(Purple Top)SARS-COV-2 Vaccination 09/24/2019, 10/14/2019, 07/06/2020  ? Tdap 05/11/2009, 01/31/2020  ? Zoster Recombinat (Shingrix) 07/08/2018  ? Zoster, Live 04/14/2018, 07/16/2018  ? ?CT Lung Screen - not indicated. Never smoker ? ?Orders Placed This Encounter  ?Procedures  ? CT Chest Wo Contrast  ?  Standing Status:   Future  ?  Standing Expiration Date:   12/12/2022  ?  Scheduling Instructions:  ?   1 year with OV after  ?  Order Specific Question:   Preferred imaging location?  ?  Answer:   [place]  ?No orders of the defined types were placed in this encounter. ? ?Return in about 1 year (around 12/12/2022). ? ?I have spent a total time of  35-minutes on the day of the appointment reviewing prior documentation, coordinating care and discussing medical diagnosis and plan with the patient/family. Past medical history, allergies, medications were reviewed. Pertinent imaging, labs and tests included in this note have been reviewed and interpreted independently by me. ? ?Claramae Rigdon Rodman Pickle, MD ?[facility] ?12/11/2021 3:24 PM  ?Office Number [phone] ? ? ? ?

## 2021-12-11 NOTE — Patient Instructions (Signed)
If you are age 67 or older, your body mass index should be between 23-30. Your Body mass index is 23.18 kg/m?Marland Kitchen If this is out of the aforementioned range listed, please consider follow up with your Primary Care Provider. ?________________________________________________________ ? ?The [facility] providers would like to encourage you to use [facility] to communicate with providers for non-urgent requests or questions.  Due to long hold times on the telephone, sending your provider a message by [facility] may be a faster and more efficient way to get a response.  Please allow 48 business hours for a response.  Please remember that this is for non-urgent requests.  ?_______________________________________________________ ? ?Your provider has requested that you go to the basement level for lab work before leaving today. Press "B" on the elevator. The lab is located at the first door on the left as you exit the elevator. ? ?You have been scheduled for an abdominal ultrasound at [facility] (1st floor of hospital) on 12-18-21 at 8:30am. Please arrive 30 minutes prior to your appointment for registration. Make certain not to have anything to eat or drink after midnight the night prior to your appointment. Should you need to reschedule your appointment, please contact radiology at [phone]. This test typically takes about 30 minutes to perform. ? ?We have sent the following medications to your pharmacy for you to pick up at your convenience: ?Colestid ? ?Thank you for entrusting me with your care and choosing [facility]. ? ?Ellouise Newer, PA-C ?

## 2021-12-11 NOTE — Progress Notes (Signed)
? ?Chief Complaint: Abnormal CT  ? ?HPI: ?   Karl [name] is a 66 year old male with a past medical history as listed below including prostate cancer, known to Dr. Silverio Decamp, who presents clinic today to discuss an abnormal CT. ?   07/13/2019 patient seen in clinic for diarrhea.  That time discussed his last colonoscopy 04/20/2015 was normal.  He was scheduled for repeat colonoscopy for further evaluation to exclude microscopic colitis.  Also discussed lactose-free diet and a fiber supplement. ?   08/17/2019 colonoscopy with mild diverticulosis in the sigmoid and descending colon and nonbleeding internal hemorrhoids.  Repeat recommended in 5 years given personal history of colon polyps.  Biopsies were negative.  He was started on Colestipol and doing well.  Refilled last 11/30/2019. ?   12/20/2020 CBC normal. ?   11/30/2021 CT of the chest without contrast done to examine lung nodule showed stable appearance of 2 left lower lobe pulmonary nodules measuring up to 3 mm as well as mildly nodular contour liver which could reflect underlying cirrhosis. ?   Today, the patient tells me that he typically follows with Karl [name] and had labs drawn back in November which were completely normal.  He works as a Marine scientist in the lab, so is aware of what these values mean and tells me the platelets and liver function tests were completely normal.  He is concerned given that he had a CT done for check on some pulmonary nodules that show that he may have cirrhosis.  He tells me he is vegan and leads a very healthy lifestyle, his only problem is some chronic diarrhea which is better with Colestipol 1 g twice a day.  Denies any family history of liver disease, previous IV drug use, alcohol use or supplement use.  His wife is with him and tells me that he did have prostate cancer. ?   Denies fever, chills, weight loss, blood in his stool, change in bowel habits, abdominal pain, nausea, vomiting or jaundice. ? ?Past Medical History:   ?Diagnosis Date  ? Colon polyps   ? Hypercholesteremia   ? Prostate cancer ([place]) 04/2016  ? ? ?Past Surgical History:  ?Procedure Laterality Date  ? PROSTATECTOMY  04/2016  ? ? ?Current Outpatient Medications  ?Medication Sig Dispense Refill  ? aspirin 81 MG tablet Take 81 mg by mouth daily.    ? colestipol (COLESTID) 1 g tablet TAKE 1 TABLET BY MOUTH TWICE A DAY 180 tablet 1  ? Cyanocobalamin (B-12) 1000 MCG CAPS     ? ezetimibe-simvastatin (VYTORIN) 10-40 MG tablet Take 1 tablet by mouth at bedtime.    ? fluticasone (FLONASE) 50 MCG/ACT nasal spray Place into the nose as needed.    ? ?No current facility-administered medications for this visit.  ? ? ?Allergies as of 12/11/2021 - Review Complete 12/11/2021  ?Allergen Reaction Noted  ? Penicillins Rash 07/04/2012  ? ? ?Family History  ?Problem Relation Age of Onset  ? Heart attack Mother   ? ? ?Social History  ? ?Socioeconomic History  ? Marital status: Married  ?  Spouse name: Not on file  ? Number of children: 0  ? Years of education: Not on file  ? Highest education level: Not on file  ?Occupational History  ? Occupation: Therapist, sports  ?Tobacco Use  ? Smoking status: Never  ? Smokeless tobacco: Never  ?Vaping Use  ? Vaping Use: Never used  ?Substance and Sexual Activity  ? Alcohol use: No  ? Drug use: No  ?  Sexual activity: Not on file  ?Other Topics Concern  ? Not on file  ?Social History Narrative  ? Not on file  ? ?Social Determinants of Health  ? ?Financial Resource Strain: Not on file  ?Food Insecurity: Not on file  ?Transportation Needs: Not on file  ?Physical Activity: Not on file  ?Stress: Not on file  ?Social Connections: Not on file  ?Intimate Partner Violence: Not on file  ? ? ?Review of Systems:    ?Constitutional: No weight loss, fever or chills ?Skin: No rash ?Cardiovascular: No chest pain ?Respiratory: No SOB  ?Gastrointestinal: See HPI and otherwise negative ? ? Physical Exam:  ?Vital signs: ?BP 130/80   Pulse 72   Ht _0  (1.702 m)   Wt 148 lb (67.1  kg)   BMI 23.18 kg/m?   ? ?Constitutional:   Pleasant Caucasian male appears to be in NAD, Well developed, Well nourished, alert and cooperative ?Head:  Normocephalic and atraumatic. ?Eyes:   PEERL, EOMI. No icterus. Conjunctiva pink. ?Ears:  Normal auditory acuity. ?Neck:  Supple ?Throat: Oral cavity and pharynx without inflammation, swelling or lesion.  ?Respiratory: Respirations even and unlabored. Lungs clear to auscultation bilaterally.   No wheezes, crackles, or rhonchi.  ?Cardiovascular: Normal S1, S2. No MRG. Regular rate and rhythm. No peripheral edema, cyanosis or pallor.  ?Gastrointestinal:  Soft, nondistended, nontender. No rebound or guarding. Normal bowel sounds. No appreciable masses or hepatomegaly. ?Rectal:  Not performed.  ?Psychiatric: Oriented to person, place and time. Demonstrates good judgement and reason without abnormal affect or behaviors. ? ?RELEVANT LABS AND IMAGING: ?CBC ?   ?Component Value Date/Time  ? WBC 5.1 12/20/2020 1208  ? WBC 6.0 10/21/2010 1255  ? RBC 4.63 12/20/2020 1208  ? RBC 4.72 10/21/2010 1255  ? HGB 14.3 12/20/2020 1208  ? HCT 41.7 12/20/2020 1208  ? PLT 239 12/20/2020 1208  ? MCV 90 12/20/2020 1208  ? MCH 30.9 12/20/2020 1208  ? MCH 30.3 10/21/2010 1255  ? MCHC 34.3 12/20/2020 1208  ? MCHC 34.5 10/21/2010 1255  ? RDW 12.5 12/20/2020 1208  ? LYMPHSABS 1.5 12/20/2020 1208  ? MONOABS 0.4 10/21/2010 1255  ? EOSABS 0.3 12/20/2020 1208  ? BASOSABS 0.1 12/20/2020 1208  ? ? ?CMP  ?   ?Component Value Date/Time  ? NA 139 12/20/2020 1208  ? K 4.8 12/20/2020 1208  ? CL 101 12/20/2020 1208  ? CO2 23 12/20/2020 1208  ? GLUCOSE 90 12/20/2020 1208  ? GLUCOSE 87 10/21/2010 1255  ? BUN 9 12/20/2020 1208  ? CREATININE 0.83 12/20/2020 1208  ? CALCIUM 9.4 12/20/2020 1208  ? GFRNONAA >60 10/21/2010 1255  ? GFRAA  10/21/2010 1255  ?  >60        ?The eGFR has been calculated ?using the MDRD equation. ?This calculation has not been ?validated in all clinical ?situations. ?eGFR's  persistently ?<60 mL/min signify ?possible Chronic Kidney Disease.  ? ? ?Assessment: ?1.  Nodular contour of the liver: Seen on CT of the chest without contrast recently, noted a "subtle nodular contour which may reflect underlying cirrhosis", patient tells me recent labs in November including CBC and CMP were completely normal, denies any risk factors for liver disease; most likely this does not represent cirrhosis ? ?Plan: ?1.  Labs including CBC and CMP ?2.  Ordered right upper quadrant ultrasound for further evaluation of nodular contour of the liver seen on chest CT ?3.  Discussed with patient that pending work-up from above he may need further labs.  He verbalized understanding. ?4.  Refilled Colestipol 1 g twice daily, #60 with 11 refills (patient requested a 30-day supply) ?5.  Patient follow in clinic per recommendations after above ? ?Ellouise Newer, PA-C ?[facility] ?12/11/2021, 10:52 AM ? ?Cc: Vernie Shanks, MD  ?

## 2021-12-11 NOTE — Patient Instructions (Signed)
?  Multiple subcentimeter nodules ?ORDER CT Chest without contrast in 11/2022 ? ?Follow-up with me in 1 year ?

## 2021-12-17 ENCOUNTER — Encounter: Payer: Self-pay | Admitting: Pulmonary Disease

## 2021-12-18 ENCOUNTER — Telehealth: Payer: Self-pay

## 2021-12-18 ENCOUNTER — Other Ambulatory Visit: Payer: Self-pay

## 2021-12-18 ENCOUNTER — Ambulatory Visit (HOSPITAL_COMMUNITY)
Admission: RE | Admit: 2021-12-18 | Discharge: 2021-12-18 | Disposition: A | Discharge status: Home / Self Care | Payer: Medicare Other | Source: Ambulatory Visit | Attending: Physician Assistant | Admitting: Physician Assistant

## 2021-12-18 DIAGNOSIS — R932 Abnormal findings on diagnostic imaging of liver and biliary tract: Secondary | ICD-10-CM | POA: Insufficient documentation

## 2021-12-18 DIAGNOSIS — R918 Other nonspecific abnormal finding of lung field: Secondary | ICD-10-CM

## 2021-12-18 DIAGNOSIS — K7689 Other specified diseases of liver: Secondary | ICD-10-CM | POA: Diagnosis not present

## 2021-12-18 NOTE — Telephone Encounter (Signed)
-----   Message from [place], MD sent at 12/17/2021  8:23 PM EDT ----- ?Regarding: Order CT Chest and recall for March 2024 ?Please order CT chest without contrast (for multiple pulmonary nodules) for next year in March 2024. Will also need recall for March 2024   ? ?

## 2021-12-20 ENCOUNTER — Telehealth: Payer: Self-pay

## 2021-12-20 DIAGNOSIS — K7460 Unspecified cirrhosis of liver: Secondary | ICD-10-CM

## 2021-12-20 DIAGNOSIS — R932 Abnormal findings on diagnostic imaging of liver and biliary tract: Secondary | ICD-10-CM

## 2021-12-20 NOTE — Telephone Encounter (Signed)
Called Vanda in radiology, she states that they are behind on reading but she will see if someone can read the pt's [place]. Patient notified via my chart. ?

## 2021-12-20 NOTE — Telephone Encounter (Signed)
-----   Message from Levin Erp, [place] sent at 12/20/2021  2:45 PM EDT ----- ?Regarding: Transfer of care ?Please call patient and let him know that Dr. Tarri Glenn is not a hepatologist at our clinic.  She functions as a general gastroenterologist here.  She previously had some experience in hepatology but does not practice that anymore.  I do not think he needs a specialist at this point given what we know.  I would rather order further work-up for him and then go from there.  If he does need a specialist we send people to [facility] in town.  If he would like to call them on his own accord now, he can. ? ? ?If he is okay with labs then would recommend an ANA, ASMA, AMA, PT/INR and alpha-1 antitrypsin as well as ceruloplasmin along with IgG and IgA. ? ?Thanks, JL L ? ?

## 2021-12-20 NOTE — Telephone Encounter (Signed)
Called and spoke with patient regarding [name]'s recommendations. Pt would like to proceed with additional labs. He knows that these labs take a little longer to result. He knows that we will be in contact once [name] has a chance to review. Pt knows that he can stop by the lab in the basement at his convenience between 7:30 am - 5 pm, M-F. Pt verbalized understanding and had no concerns at the end of the call. ? ?Lab orders in epic. ?

## 2021-12-23 ENCOUNTER — Other Ambulatory Visit (INDEPENDENT_AMBULATORY_CARE_PROVIDER_SITE_OTHER): Payer: Medicare Other

## 2021-12-23 DIAGNOSIS — K7460 Unspecified cirrhosis of liver: Secondary | ICD-10-CM | POA: Diagnosis not present

## 2021-12-23 DIAGNOSIS — R932 Abnormal findings on diagnostic imaging of liver and biliary tract: Secondary | ICD-10-CM | POA: Diagnosis not present

## 2021-12-23 LAB — PROTIME-INR
INR: 1.1 ratio — ABNORMAL HIGH (ref 0.8–1.0)
Prothrombin Time: 11.9 s (ref 9.6–13.1)

## 2021-12-24 NOTE — Progress Notes (Signed)
Reviewed and agree with documentation and assessment and plan. K. Veena Helmut Hennon , MD   

## 2021-12-25 ENCOUNTER — Other Ambulatory Visit: Payer: Self-pay

## 2021-12-25 DIAGNOSIS — R932 Abnormal findings on diagnostic imaging of liver and biliary tract: Secondary | ICD-10-CM

## 2021-12-25 DIAGNOSIS — K7460 Unspecified cirrhosis of liver: Secondary | ICD-10-CM

## 2021-12-26 ENCOUNTER — Telehealth: Payer: Self-pay | Admitting: Physician Assistant

## 2021-12-26 LAB — IGG: IgG (Immunoglobin G), Serum: 1084 mg/dL (ref 600–1540)

## 2021-12-26 LAB — ANTI-NUCLEAR AB-TITER (ANA TITER): ANA Titer 1: 1:40 {titer} — ABNORMAL HIGH

## 2021-12-26 LAB — ALPHA-1-ANTITRYPSIN: A-1 Antitrypsin, Ser: 110 mg/dL (ref 83–199)

## 2021-12-26 LAB — IGA: Immunoglobulin A: 110 mg/dL (ref 70–320)

## 2021-12-26 LAB — ANTI-SMOOTH MUSCLE ANTIBODY, IGG: Actin (Smooth Muscle) Antibody (IGG): <20 U (ref <20)

## 2021-12-26 LAB — MITOCHONDRIAL ANTIBODIES: Mitochondrial M2 Ab, IgG: <=20 U (ref <=20.0)

## 2021-12-26 LAB — ANA: Anti Nuclear Antibody (ANA): POSITIVE — AB

## 2021-12-26 LAB — CERULOPLASMIN: Ceruloplasmin: 21 mg/dL (ref 18–36)

## 2021-12-26 NOTE — Telephone Encounter (Signed)
Patient spouse Stanton Kidney) called regarding imaging scheduled for 01/08/22. Spouse wants to know why this was scheduled at an [place] location and not [place]. Please advise. ?

## 2021-12-26 NOTE — Telephone Encounter (Signed)
I returned call to patient's wife, I informed her that the order was placed for [facility] hospital. I told pt's wife that she can call radiology scheduling and see if they can change the location for them. I provided pt's wife with the phone number to radiology scheduling. Wife verbalized understanding and had no concerns at the end of the call.  ?

## 2021-12-27 DIAGNOSIS — R768 Other specified abnormal immunological findings in serum: Secondary | ICD-10-CM | POA: Diagnosis not present

## 2021-12-27 DIAGNOSIS — K7469 Other cirrhosis of liver: Secondary | ICD-10-CM | POA: Diagnosis not present

## 2021-12-27 DIAGNOSIS — Z6822 Body mass index (BMI) 22.0-22.9, adult: Secondary | ICD-10-CM | POA: Diagnosis not present

## 2021-12-31 ENCOUNTER — Ambulatory Visit: Payer: Medicare Other | Admitting: Cardiovascular Disease

## 2021-12-31 ENCOUNTER — Telehealth: Payer: Self-pay | Admitting: Physician Assistant

## 2021-12-31 NOTE — Telephone Encounter (Signed)
Ok, thanks.

## 2021-12-31 NOTE — Telephone Encounter (Signed)
See 4/4 telephone encounter for additional details. ?

## 2021-12-31 NOTE — Telephone Encounter (Signed)
Telephone Call ? ?12/31/21 ? ?Spoke with patient.  He is just concerned about the underlying cause of this possible cirrhosis and wants to make sure that he is doing everything in his power to prevent it from progressing.  I discussed that he has no signs of worsening liver disease, his platelets are normal, albumin normal.  Explained that the FibroScan we are doing this week will help [place] to come up with a stage of his liver disease which will also tell us how diligent he needs to be about changing his diet etc.  Discussed the likelihood of fatty liver.   ? ?Patient would like to keep his scheduled appointment in May with Dr. Silverio Decamp but is also interested in referral to the liver clinic here in town.  I will have my nurse Herbert Seta work on that for [place]. ? ?Patient thanked me for the call. ? ?Ellouise Newer, PA-C ?

## 2021-12-31 NOTE — Telephone Encounter (Signed)
[place],  ?Please get him in for follow-up visit next available appointment to discuss all his concerns and also expedite right ultrasound with elastography which would help [place] determine the degree of fibrosis in the liver.  Based on labs he does not have any signs of advanced liver disease or cirrhosis.  Findings on CT are not consistent with the lab findings and may likely be erroneous and that is the reason we need to confirm with right upper quadrant ultrasound with elastography. His last visit was with [name] who had requested labs for additional evaluation that is negative for any underlying chronic liver disease. ? ?

## 2021-12-31 NOTE — Telephone Encounter (Signed)
Pt has been scheduled for a f/u appt with Dr. Silverio Decamp on Wednesday, 02/12/22 at 10:10 am. ? ?Referral, records, pt's demographics and insurance information has been faxed to [facility] in [place] (P: [phone], F: [phone]). ? ?Patient notified of information via my chart. ?

## 2022-01-02 ENCOUNTER — Ambulatory Visit (INDEPENDENT_AMBULATORY_CARE_PROVIDER_SITE_OTHER): Payer: Medicare Other | Admitting: Cardiovascular Disease

## 2022-01-02 ENCOUNTER — Encounter: Payer: Self-pay | Admitting: Cardiovascular Disease

## 2022-01-02 VITALS — BP 134/76 | HR 75 | Ht 67.0 in | Wt 143.0 lb

## 2022-01-02 DIAGNOSIS — R002 Palpitations: Secondary | ICD-10-CM | POA: Diagnosis not present

## 2022-01-02 DIAGNOSIS — R9431 Abnormal electrocardiogram [ECG] [EKG]: Secondary | ICD-10-CM

## 2022-01-02 NOTE — Patient Instructions (Signed)
Medication Instructions:  ?Your physician recommends that you continue on your current medications as directed. Please refer to the Current Medication list given to you today. ? ?*If you need a refill on your cardiac medications before your next appointment, please call your pharmacy* ? ? ?Lab Work: ?NONE ?If you have labs (blood work) drawn today and your tests are completely normal, you will receive your results only by: ?MyChart Message (if you have MyChart) OR ?A paper copy in the mail ?If you have any lab test that is abnormal or we need to change your treatment, we will call you to review the results. ? ? ?Testing/Procedures: ?ECHO ?Your physician has requested that you have an echocardiogram. Echocardiography is a painless test that uses sound waves to create images of your heart. It provides your doctor with information about the size and shape of your heart and how well your heart?s chambers and valves are working. This procedure takes approximately one hour. There are no restrictions for this procedure. ? ? ? ?Follow-Up: ?At [facility], you and your health needs are our priority.  As part of our continuing mission to provide you with exceptional heart care, we have created designated Provider Care Teams.  These Care Teams include your primary Cardiologist (physician) and Advanced Practice Providers (APPs -  Physician Assistants and Nurse Practitioners) who all work together to provide you with the care you need, when you need it. ? ?Your next appointment:   ?1 year(s) ? ?The format for your next appointment:   ?In Person ? ?Provider:   ?Sherren Mocha, MD   ? ? ?  ?

## 2022-01-02 NOTE — Progress Notes (Signed)
?Cardiology Office Note:   ? ?Date:  01/02/2022  ? ?ID:  Karl [name], DOB [birth year], MRN [id number] ? ?PCP:  Vernie Shanks, MD ?  ?[facility] ?Cardiologist:  Sherren Mocha, MD    ? ?Referring MD: Vernie Shanks, MD  ? ?Chief Complaint  ?Patient presents with  ? Palpitations  ? ? ?History of Present Illness:   ? ?Karl [name] is a 66 y.o. male with a hx of heart palpitations and mixed hyperlipidemia, presenting for follow-up evaluation.  I last saw him 1 year ago when he was noted to have an abnormal EKG with ST/T wave changes concerning for the presence of anterolateral and inferior ischemia.  Because of his EKG abnormality, hyperlipidemia, and family history of coronary artery disease, I recommended a gated coronary CTA study. Coronary CTA showed normal coronary arteries. A ZIO monitor was performed to evaluate worsening palpitations and this showed sinus rhythm with few short runs of SVT (see below for complete study summary).  ? ?The patient is here with his wife today.  He describes episodic heart palpitations.  Symptoms of been a little better over the past week or 2.  He has not had any chest pain or pressure.  He exercises regularly with no exertional symptoms.  No lightheadedness or syncope.  Since I saw him last, a coronary CTA was done with findings outlined above.  He was incidentally noted to have a few pulmonary nodules and he has been followed by our pulmonary medicine group.  On his most recent CTA, there were some cuts that included upper abdominal imaging and a nodular appearance of the liver was noted suspicious for hepatic cirrhosis.  The patient has no risk factors for cirrhosis.  A right upper quadrant ultrasound was completed and confirmed a nodular liver border suggestive of cirrhosis.  He has upcoming testing and follow-up with gastroenterology. ? ?Past Medical History:  ?Diagnosis Date  ? Colon polyps   ? Hypercholesteremia   ? Prostate cancer ([place]) 04/2016  ? ? ?Past  Surgical History:  ?Procedure Laterality Date  ? PROSTATECTOMY  04/2016  ? ? ?Current Medications: ?Current Meds  ?Medication Sig  ? colestipol (COLESTID) 1 g tablet Take 1 tablet (1 g total) by mouth 2 (two) times daily.  ? Cyanocobalamin (B-12) 1000 MCG CAPS   ? ezetimibe-simvastatin (VYTORIN) 10-40 MG tablet Take 1 tablet by mouth at bedtime.  ? fluticasone (FLONASE) 50 MCG/ACT nasal spray Place into the nose as needed.  ?  ? ?Allergies:   Penicillins  ? ?Social History  ? ?Socioeconomic History  ? Marital status: Married  ?  Spouse name: Not on file  ? Number of children: 0  ? Years of education: Not on file  ? Highest education level: Not on file  ?Occupational History  ? Occupation: Therapist, sports  ?Tobacco Use  ? Smoking status: Never  ? Smokeless tobacco: Never  ?Vaping Use  ? Vaping Use: Never used  ?Substance and Sexual Activity  ? Alcohol use: No  ? Drug use: No  ? Sexual activity: Not on file  ?Other Topics Concern  ? Not on file  ?Social History Narrative  ? Not on file  ? ?Social Determinants of Health  ? ?Financial Resource Strain: Not on file  ?Food Insecurity: Not on file  ?Transportation Needs: Not on file  ?Physical Activity: Not on file  ?Stress: Not on file  ?Social Connections: Not on file  ?  ? ?Family History: ?The patient's family history includes Heart attack  in his mother. ? ?ROS:   ?Please see the history of present illness.    ?All other systems reviewed and are negative. ? ?EKGs/Labs/Other Studies Reviewed:   ? ?The following studies were reviewed today: ?ZIO Monitor: ?Patient had a min HR of 48 bpm, max HR of 158 bpm, and avg HR of 69 bpm. Predominant underlying rhythm was Sinus Rhythm. 5 Supraventricular Tachycardia runs occurred, the run with the fastest interval lasting 5 beats with a max rate of 158 bpm, the  ?longest lasting 13 beats with an avg rate of 116 bpm. Supraventricular Tachycardia was detected within +/- 45 seconds of symptomatic patient event(s). Isolated SVEs were rare (<1.0%),  SVE Couplets were rare (<1.0%), and SVE Triplets were rare (<1.0%).  ?Isolated VEs were rare (<1.0%, 14), VE Couplets were rare (<1.0%, 1), and VE Triplets were rare (<1.0%, 1).  ?  ?Summary: the basic rhythm is normal sinus with an average HR of 69 bpm. There are rare supraventricular beats with short supraventricular runs. No sustained arrhythmia, bradycardic events, or atrial fib/flutter.  ? ?Coronary CTA 12/27/2020: ?IMPRESSION: ?1. No evidence of CAD, CADRADS = 0. ?  ?2. Coronary calcium score of 0. This was 0 percentile for age and ?sex matched control. ?  ?3. Normal coronary origin with right dominance. ?  ?4. Punctate aortic annular and mitral valvular calcification. ?  ?5. Aortic atherosclerosis. ?  ?6. Cardiac risk factor modification is recommended. ? ?EKG:  EKG is ordered today.  The ekg ordered today demonstrates normal sinus rhythm 75 bpm, T wave abnormality consider anterolateral ischemia unchanged from previous tracing ? ?Recent Labs: ?12/11/2021: ALT 23; BUN 11; Creatinine, Ser 0.93; Hemoglobin 14.5; Platelets 246.0; Potassium 4.6; Sodium 137  ?Recent Lipid Panel ?No results found for: CHOL, TRIG, HDL, CHOLHDL, VLDL, LDLCALC, LDLDIRECT ? ? ?Risk Assessment/Calculations:   ?  ? ?    ? ?Physical Exam:   ? ?VS:  BP 134/76   Pulse 75   Ht '5\' 7"'$  (1.702 m)   Wt 143 lb (64.9 kg)   SpO2 98%   BMI 22.40 kg/m?    ? ?Wt Readings from Last 3 Encounters:  ?01/02/22 143 lb (64.9 kg)  ?12/11/21 147 lb 3.2 oz (66.8 kg)  ?12/11/21 148 lb (67.1 kg)  ?  ? ?GEN:  Well nourished, well developed in no acute distress ?HEENT: Normal ?NECK: No JVD; No carotid bruits ?LYMPHATICS: No lymphadenopathy ?CARDIAC: RRR, no murmurs, rubs, gallops, midsystolic click appreciated at the apex ?RESPIRATORY:  Clear to auscultation without rales, wheezing or rhonchi  ?ABDOMEN: Soft, non-tender, non-distended ?MUSCULOSKELETAL:  No edema; No deformity  ?SKIN: Warm and dry ?NEUROLOGIC:  Alert and oriented x 3 ?PSYCHIATRIC:  Normal affect   ? ?ASSESSMENT:   ? ?1. EKG abnormalities   ?2. Palpitations   ? ?PLAN:   ? ?In order of problems listed above: ? ?Patient with gated coronary CTA last year showing no significant abnormalities.  Widely patent coronary arteries with 0 calcium and with no atherosclerotic plaquing identified.  Excellent result. ?Continues to have heart palpitations.  Monitor result reviewed.  With palpitations and abnormal cardiac exam with midsystolic click, favor 2D echocardiogram to evaluate for mitral valve prolapse or other valvular/structural abnormalities. ? ?   ? ?   ? ? ?Medication Adjustments/Labs and Tests Ordered: ?Current medicines are reviewed at length with the patient today.  Concerns regarding medicines are outlined above.  ?Orders Placed This Encounter  ?Procedures  ? EKG 12-Lead  ? ECHOCARDIOGRAM COMPLETE  ? ?No  orders of the defined types were placed in this encounter. ? ? ?Patient Instructions  ?Medication Instructions:  ?Your physician recommends that you continue on your current medications as directed. Please refer to the Current Medication list given to you today. ? ?*If you need a refill on your cardiac medications before your next appointment, please call your pharmacy* ? ? ?Lab Work: ?NONE ?If you have labs (blood work) drawn today and your tests are completely normal, you will receive your results only by: ?MyChart Message (if you have MyChart) OR ?A paper copy in the mail ?If you have any lab test that is abnormal or we need to change your treatment, we will call you to review the results. ? ? ?Testing/Procedures: ?ECHO ?Your physician has requested that you have an echocardiogram. Echocardiography is a painless test that uses sound waves to create images of your heart. It provides your doctor with information about the size and shape of your heart and how well your heart?s chambers and valves are working. This procedure takes approximately one hour. There are no restrictions for this  procedure. ? ? ? ?Follow-Up: ?At [facility], you and your health needs are our priority.  As part of our continuing mission to provide you with exceptional heart care, we have created designated Provider Care Teams.  These Care

## 2022-01-03 ENCOUNTER — Ambulatory Visit (HOSPITAL_COMMUNITY)
Admission: RE | Admit: 2022-01-03 | Discharge: 2022-01-03 | Disposition: A | Discharge status: Home / Self Care | Payer: Medicare Other | Source: Ambulatory Visit | Attending: Physician Assistant | Admitting: Physician Assistant

## 2022-01-03 DIAGNOSIS — K7460 Unspecified cirrhosis of liver: Secondary | ICD-10-CM | POA: Diagnosis not present

## 2022-01-03 DIAGNOSIS — R932 Abnormal findings on diagnostic imaging of liver and biliary tract: Secondary | ICD-10-CM | POA: Insufficient documentation

## 2022-01-08 ENCOUNTER — Ambulatory Visit: Payer: Medicare Other

## 2022-01-08 DIAGNOSIS — L814 Other melanin hyperpigmentation: Secondary | ICD-10-CM | POA: Diagnosis not present

## 2022-01-08 DIAGNOSIS — D1801 Hemangioma of skin and subcutaneous tissue: Secondary | ICD-10-CM | POA: Diagnosis not present

## 2022-01-08 DIAGNOSIS — L821 Other seborrheic keratosis: Secondary | ICD-10-CM | POA: Diagnosis not present

## 2022-01-08 DIAGNOSIS — Z85828 Personal history of other malignant neoplasm of skin: Secondary | ICD-10-CM | POA: Diagnosis not present

## 2022-01-08 DIAGNOSIS — D171 Benign lipomatous neoplasm of skin and subcutaneous tissue of trunk: Secondary | ICD-10-CM | POA: Diagnosis not present

## 2022-01-08 DIAGNOSIS — D225 Melanocytic nevi of trunk: Secondary | ICD-10-CM | POA: Diagnosis not present

## 2022-01-16 ENCOUNTER — Ambulatory Visit (HOSPITAL_COMMUNITY): Payer: Medicare Other | Attending: Cardiology

## 2022-01-16 DIAGNOSIS — R9431 Abnormal electrocardiogram [ECG] [EKG]: Secondary | ICD-10-CM | POA: Diagnosis not present

## 2022-01-16 DIAGNOSIS — R002 Palpitations: Secondary | ICD-10-CM

## 2022-01-16 LAB — ECHOCARDIOGRAM COMPLETE
Area-P 1/2: 3.65 cm2
P 1/2 time: 209 msec
S' Lateral: 2.8 cm

## 2022-01-24 DIAGNOSIS — D171 Benign lipomatous neoplasm of skin and subcutaneous tissue of trunk: Secondary | ICD-10-CM | POA: Diagnosis not present

## 2022-01-27 ENCOUNTER — Telehealth: Payer: Self-pay

## 2022-01-27 ENCOUNTER — Ambulatory Visit (INDEPENDENT_AMBULATORY_CARE_PROVIDER_SITE_OTHER): Payer: Medicare Other

## 2022-01-27 DIAGNOSIS — R002 Palpitations: Secondary | ICD-10-CM

## 2022-01-27 NOTE — Progress Notes (Unsigned)
[id number] zio xt from office inventory applied to patient. ?

## 2022-01-27 NOTE — Telephone Encounter (Signed)
Verbal order from Dr Burt Knack for a 3 day zio monitor for palpitations. Order placed at this time. ?

## 2022-02-06 DIAGNOSIS — R002 Palpitations: Secondary | ICD-10-CM | POA: Diagnosis not present

## 2022-02-11 DIAGNOSIS — H47333 Pseudopapilledema of optic disc, bilateral: Secondary | ICD-10-CM | POA: Diagnosis not present

## 2022-02-11 DIAGNOSIS — Z23 Encounter for immunization: Secondary | ICD-10-CM | POA: Diagnosis not present

## 2022-02-11 DIAGNOSIS — E785 Hyperlipidemia, unspecified: Secondary | ICD-10-CM | POA: Diagnosis not present

## 2022-02-11 DIAGNOSIS — R932 Abnormal findings on diagnostic imaging of liver and biliary tract: Secondary | ICD-10-CM | POA: Diagnosis not present

## 2022-02-11 DIAGNOSIS — N5231 Erectile dysfunction following radical prostatectomy: Secondary | ICD-10-CM | POA: Diagnosis not present

## 2022-02-11 DIAGNOSIS — Z8546 Personal history of malignant neoplasm of prostate: Secondary | ICD-10-CM | POA: Diagnosis not present

## 2022-02-11 DIAGNOSIS — Z0000 Encounter for general adult medical examination without abnormal findings: Secondary | ICD-10-CM | POA: Diagnosis not present

## 2022-02-12 ENCOUNTER — Ambulatory Visit: Payer: Medicare Other | Admitting: Gastroenterology

## 2022-02-14 DIAGNOSIS — D171 Benign lipomatous neoplasm of skin and subcutaneous tissue of trunk: Secondary | ICD-10-CM | POA: Diagnosis not present

## 2022-02-18 ENCOUNTER — Telehealth: Payer: Self-pay

## 2022-02-18 MED ORDER — METOPROLOL TARTRATE 25 MG PO TABS: ORAL_TABLET | ORAL | 1 refills | Status: DC

## 2022-02-18 NOTE — Telephone Encounter (Signed)
Metoprolol tartrate sent into pharmacy at this time. Pt aware of results via Dr Burt Knack.

## 2022-02-18 NOTE — Telephone Encounter (Signed)
-----   Message from Sherren Mocha, MD sent at 02/13/2022 12:37 PM EDT ----- Reviewed result with patient over the telephone. Advised metoprolol 25 mg BID only as needed. thanks

## 2022-02-19 ENCOUNTER — Ambulatory Visit: Payer: Medicare Other | Admitting: Cardiovascular Disease

## 2022-03-28 DIAGNOSIS — H90A32 Mixed conductive and sensorineural hearing loss, unilateral, left ear with restricted hearing on the contralateral side: Secondary | ICD-10-CM | POA: Diagnosis not present

## 2022-03-28 DIAGNOSIS — H938X2 Other specified disorders of left ear: Secondary | ICD-10-CM | POA: Diagnosis not present

## 2022-03-28 DIAGNOSIS — Z9889 Other specified postprocedural states: Secondary | ICD-10-CM | POA: Diagnosis not present

## 2022-03-28 DIAGNOSIS — H9312 Tinnitus, left ear: Secondary | ICD-10-CM | POA: Diagnosis not present

## 2022-03-28 DIAGNOSIS — H9193 Unspecified hearing loss, bilateral: Secondary | ICD-10-CM | POA: Diagnosis not present

## 2022-03-28 DIAGNOSIS — J342 Deviated nasal septum: Secondary | ICD-10-CM | POA: Diagnosis not present

## 2022-04-02 ENCOUNTER — Other Ambulatory Visit: Payer: Self-pay | Admitting: Surgery

## 2022-04-02 DIAGNOSIS — D171 Benign lipomatous neoplasm of skin and subcutaneous tissue of trunk: Secondary | ICD-10-CM | POA: Diagnosis not present

## 2022-04-16 DIAGNOSIS — J3489 Other specified disorders of nose and nasal sinuses: Secondary | ICD-10-CM | POA: Diagnosis not present

## 2022-04-16 DIAGNOSIS — J342 Deviated nasal septum: Secondary | ICD-10-CM | POA: Diagnosis not present

## 2022-04-16 DIAGNOSIS — J343 Hypertrophy of nasal turbinates: Secondary | ICD-10-CM | POA: Diagnosis not present

## 2022-04-16 DIAGNOSIS — M950 Acquired deformity of nose: Secondary | ICD-10-CM | POA: Diagnosis not present

## 2022-06-09 IMAGING — CT CT HEART MORP W/ CTA COR W/ SCORE W/ CA W/CM &/OR W/O CM
4 of 7 series · 8 of 20 positions shown, 9 images · IV contrast (APPLIED)
Comparison: None.

Addendum:
HISTORY: 64 yo male with ECG abnormal, 10yr CHD risk 10-20%, treadmill
candidate

EXAM:
Cardiac/Coronary CTA
TECHNIQUE: The patient was scanned on a Siemens Force scanner.
PROTOCOL: A 120 kV prospective scan was triggered in the descending thoracic
aorta at 111 HU's. Axial non-contrast 3 mm slices were carried out
through the heart. The data set was analyzed on a dedicated work
station and scored using the Agatson method. Gantry rotation speed
was 250 msecs and collimation was .6 mm. Beta blockade and 0.8 mg of
sl NTG was given. The 3D data set was reconstructed in 5% intervals
of the 67-82 % of the R-R cycle. Diastolic phases were analyzed on a
dedicated work station using MPR, MIP and VRT modes. The patient
received of contrast.

[Series 6: best diast 75 % · axial · 0.39mm/px · z∈[-208,-165]mm · 2 of 318 slices shown]
[im 106/318  vessel]
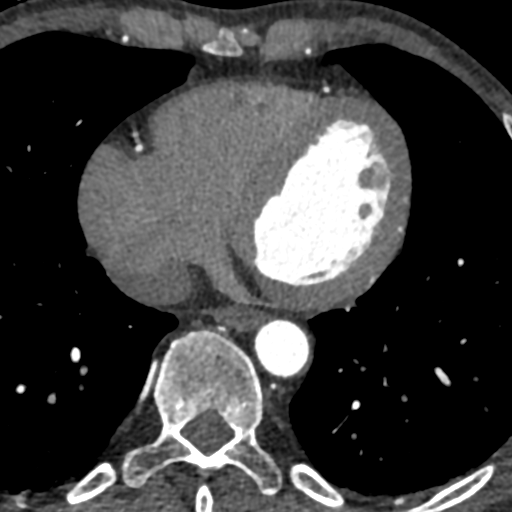
[im 212/318  vessel]
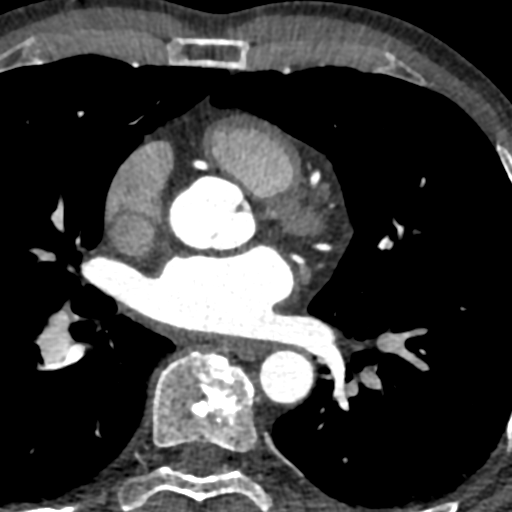

[Series 7: best syst · axial · 0.39mm/px · z∈[-208,-165]mm · 2 of 318 slices shown, 3 images]
[im 106/318  vessel]
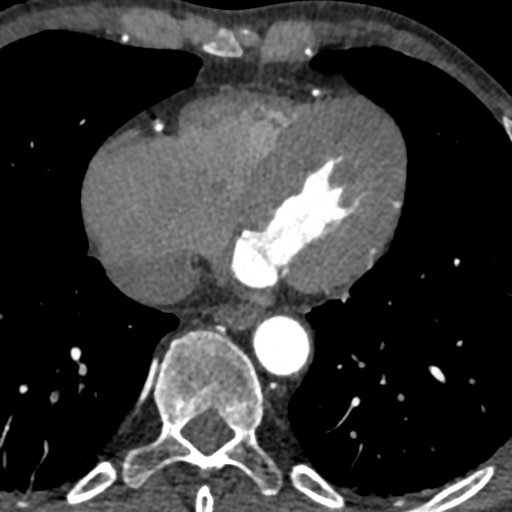
[im 106/318  lung]
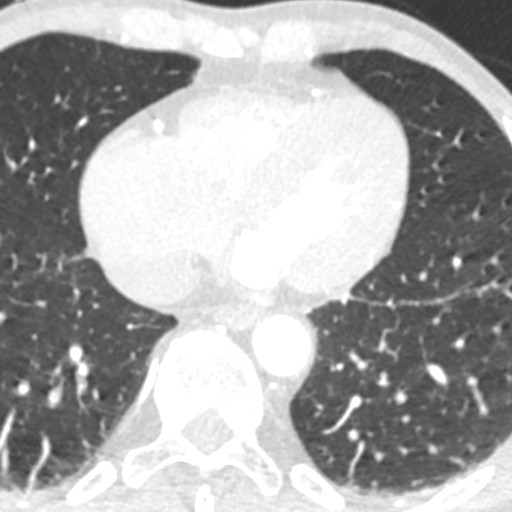
[im 212/318  vessel]
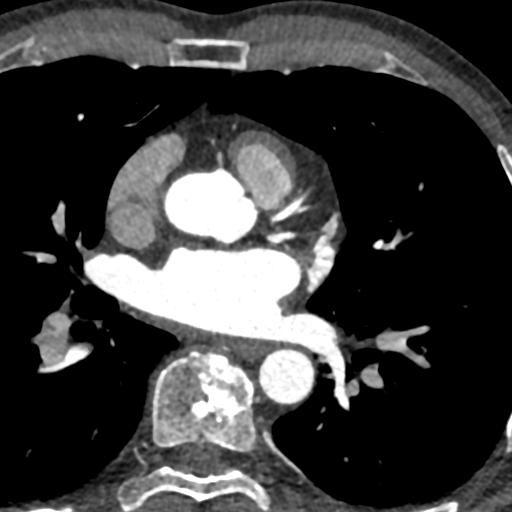

[Series 8: ts diast sharp 75 % · axial · 0.39mm/px · z∈[-208,-165]mm · 2 of 318 slices shown]
[im 106/318  lung]
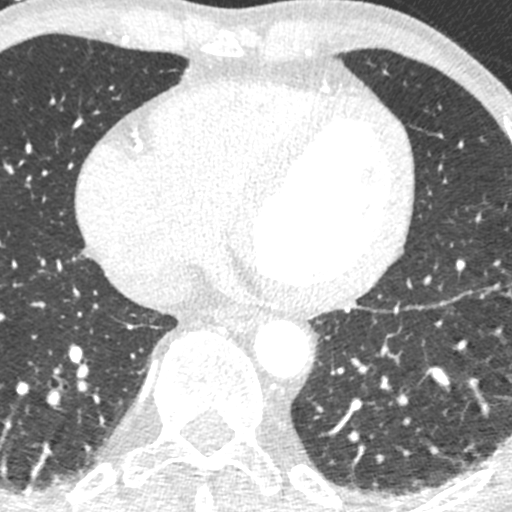
[im 212/318  lung]
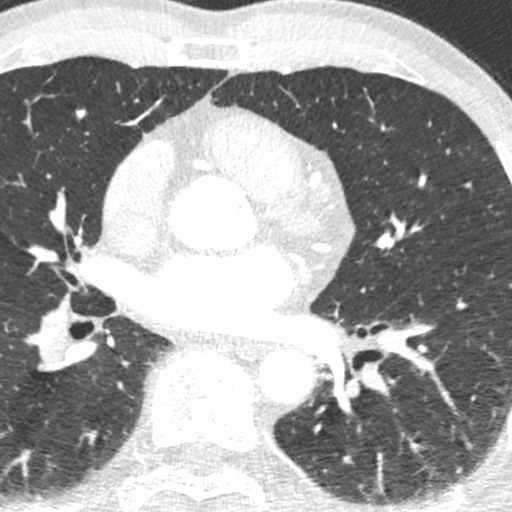

[Series 9: ts syst sharp · axial · 0.39mm/px · z∈[-208,-165]mm · 2 of 318 slices shown]
[im 106/318  lung]
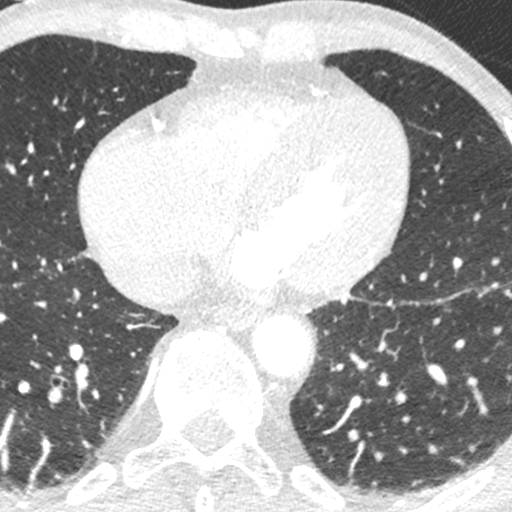
[im 212/318  lung]
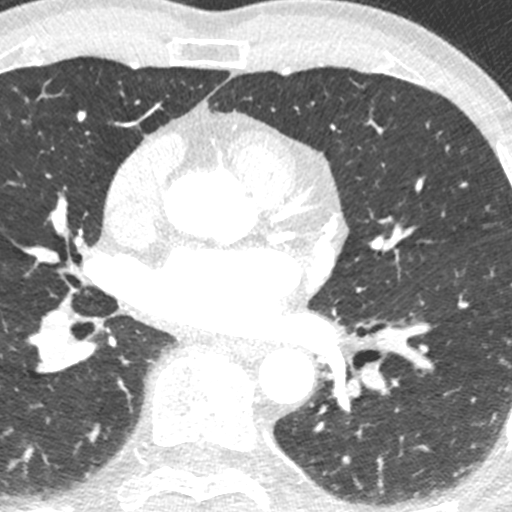

[8 of 20 positions shown; findings below may reference images not displayed]

FINDINGS: Quality: Excellent, HR 65

Coronary calcium score: The patient's coronary artery calcium score
is 0, which places the patient in the 0 percentile.

Coronary arteries: Normal coronary origins.  Right dominance.

Right Coronary Artery: Dominant.  No disease.

Left Main Coronary Artery: Normal. Bifurcates into the LAD and LCx
arteries.

Left Anterior Descending Coronary Artery: Large anterior vessel that
wraps around the apex. No disease. 2 small diagonal branches without
stenosis.

Left Circumflex Artery: AV groove LCX without stenosis. There is a
large high OM1 branch without disease.

Aorta: Normal size, 27 mm at the mid ascending aorta (level of the
PA bifurcation) measured double oblique. Aortic atherosclerosis. No
dissection.

Aortic Valve: Trileaflet.  Punctate calcifications.

Other findings:

Normal pulmonary vein drainage into the left atrium.

Normal left atrial appendage without a thrombus.

Normal size of the pulmonary artery.

Punctate mitral annular calcification.
IMPRESSION: 1. No evidence of CAD, CADRADS = 0.

2. Coronary calcium score of 0. This was 0 percentile for age and
sex matched control.

3. Normal coronary origin with right dominance.

4. Punctate aortic annular and mitral valvular calcification.

5. Aortic atherosclerosis.

6. Cardiac risk factor modification is recommended.

EXAM:
OVER-READ INTERPRETATION  CT CHEST

The following report is an over-read performed by radiologist Dr.
does not include interpretation of cardiac or coronary anatomy or
pathology. The coronary CTA interpretation by the cardiologist is
attached.
FINDINGS: Cardiovascular: Normal heart size. No significant pericardial
effusion/thickening. Great vessels are normal in course and caliber.
No central pulmonary emboli.

Mediastinum/Nodes: Unremarkable esophagus. No pathologically
enlarged mediastinal or hilar lymph nodes.

Lungs/Pleura: No pneumothorax. No pleural effusion. No acute
consolidative airspace disease or lung masses. Two small solid
anterior left lower lobe pulmonary nodules, largest 3 mm (series
12/image 7).

Upper abdomen: No acute abnormality.

Musculoskeletal: No aggressive appearing focal osseous lesions. Mild
thoracic spondylosis.
IMPRESSION: Two small solid anterior left lower lobe pulmonary nodules, largest
3 mm. No follow-up needed if patient is low-risk (and has no known
or suspected primary neoplasm). Non-contrast chest CT can be
considered in 12 months if patient is high-risk. This recommendation
follows the consensus statement: Guidelines for Management of
Incidental Pulmonary Nodules Detected on CT Images: From the

*** End of Addendum ***
FINDINGS: Quality: Excellent, HR 65

Coronary calcium score: The patient's coronary artery calcium score
is 0, which places the patient in the 0 percentile.

Coronary arteries: Normal coronary origins.  Right dominance.

Right Coronary Artery: Dominant.  No disease.

Left Main Coronary Artery: Normal. Bifurcates into the LAD and LCx
arteries.

Left Anterior Descending Coronary Artery: Large anterior vessel that
wraps around the apex. No disease. 2 small diagonal branches without
stenosis.

Left Circumflex Artery: AV groove LCX without stenosis. There is a
large high OM1 branch without disease.

Aorta: Normal size, 27 mm at the mid ascending aorta (level of the
PA bifurcation) measured double oblique. Aortic atherosclerosis. No
dissection.

Aortic Valve: Trileaflet.  Punctate calcifications.

Other findings:

Normal pulmonary vein drainage into the left atrium.

Normal left atrial appendage without a thrombus.

Normal size of the pulmonary artery.

Punctate mitral annular calcification.
IMPRESSION: 1. No evidence of CAD, CADRADS = 0.

2. Coronary calcium score of 0. This was 0 percentile for age and
sex matched control.

3. Normal coronary origin with right dominance.

4. Punctate aortic annular and mitral valvular calcification.

5. Aortic atherosclerosis.

6. Cardiac risk factor modification is recommended.

## 2022-06-16 DIAGNOSIS — R918 Other nonspecific abnormal finding of lung field: Secondary | ICD-10-CM | POA: Diagnosis not present

## 2022-06-16 DIAGNOSIS — K7400 Hepatic fibrosis, unspecified: Secondary | ICD-10-CM | POA: Diagnosis not present

## 2022-07-28 DIAGNOSIS — K769 Liver disease, unspecified: Secondary | ICD-10-CM | POA: Diagnosis not present

## 2022-07-28 DIAGNOSIS — K7400 Hepatic fibrosis, unspecified: Secondary | ICD-10-CM | POA: Diagnosis not present

## 2022-08-20 DIAGNOSIS — Z8546 Personal history of malignant neoplasm of prostate: Secondary | ICD-10-CM | POA: Diagnosis not present

## 2022-08-20 DIAGNOSIS — E7800 Pure hypercholesterolemia, unspecified: Secondary | ICD-10-CM | POA: Diagnosis not present

## 2022-10-08 ENCOUNTER — Other Ambulatory Visit: Payer: Self-pay

## 2022-10-08 MED ORDER — COLESTIPOL HCL 1 G PO TABS: 1.0000 g | ORAL_TABLET | Freq: Two times a day (BID) | ORAL | 1 refills | Status: DC

## 2022-10-08 NOTE — Telephone Encounter (Signed)
COLESTIPOL REFILLED TO [facility], SEEN 11/2021.

## 2022-10-13 ENCOUNTER — Other Ambulatory Visit: Payer: Self-pay

## 2022-10-13 MED ORDER — METOPROLOL TARTRATE 25 MG PO TABS: ORAL_TABLET | ORAL | 1 refills | Status: DC

## 2022-11-05 DIAGNOSIS — H47333 Pseudopapilledema of optic disc, bilateral: Secondary | ICD-10-CM | POA: Diagnosis not present

## 2022-11-05 DIAGNOSIS — H524 Presbyopia: Secondary | ICD-10-CM | POA: Diagnosis not present

## 2022-11-05 DIAGNOSIS — H2513 Age-related nuclear cataract, bilateral: Secondary | ICD-10-CM | POA: Diagnosis not present

## 2022-11-26 DIAGNOSIS — R221 Localized swelling, mass and lump, neck: Secondary | ICD-10-CM | POA: Diagnosis not present

## 2022-11-26 DIAGNOSIS — K921 Melena: Secondary | ICD-10-CM | POA: Diagnosis not present

## 2022-11-26 DIAGNOSIS — L820 Inflamed seborrheic keratosis: Secondary | ICD-10-CM | POA: Diagnosis not present

## 2022-11-26 DIAGNOSIS — Z8546 Personal history of malignant neoplasm of prostate: Secondary | ICD-10-CM | POA: Diagnosis not present

## 2022-11-27 ENCOUNTER — Encounter: Payer: Self-pay | Admitting: Physician Assistant

## 2022-11-27 ENCOUNTER — Other Ambulatory Visit: Payer: Self-pay | Admitting: Family Medicine

## 2022-11-27 DIAGNOSIS — R221 Localized swelling, mass and lump, neck: Secondary | ICD-10-CM

## 2022-12-05 ENCOUNTER — Telehealth: Payer: Self-pay | Admitting: Pulmonary Disease

## 2022-12-05 NOTE — Telephone Encounter (Signed)
PT has appt w/Dr. Loanne Drilling and she wants a CT done before his appt./ He is having a stomach CT done on the 22nd of this mo. Can we do both at the same time? Pls call PT or wife @ [phone] or [phone]

## 2022-12-11 ENCOUNTER — Encounter: Payer: Self-pay | Admitting: Physician Assistant

## 2022-12-11 ENCOUNTER — Ambulatory Visit: Payer: Medicare Other | Admitting: Physician Assistant

## 2022-12-11 VITALS — BP 124/66 | HR 75 | Ht 67.0 in | Wt 144.0 lb

## 2022-12-11 DIAGNOSIS — Z8619 Personal history of other infectious and parasitic diseases: Secondary | ICD-10-CM

## 2022-12-11 DIAGNOSIS — K921 Melena: Secondary | ICD-10-CM | POA: Diagnosis not present

## 2022-12-11 DIAGNOSIS — R198 Other specified symptoms and signs involving the digestive system and abdomen: Secondary | ICD-10-CM

## 2022-12-11 NOTE — Progress Notes (Signed)
Chief Complaint: Iron deficiency anemia  HPI:    Karl [name] is a 67 year old male with past medical history as listed below including prostate cancer, known to Dr. Silverio Decamp, who was referred to me by Lujean Amel, MD for a complaint of iron deficiency anemia.      07/13/2019 patient seen in clinic for diarrhea.  At that time discussed his last colonoscopy 04/20/2015 was normal.  He was scheduled for repeat colonoscopy for further evaluation to exclude microscopic colitis.  Also discussed lactose-free diet and a fiber supplement.    08/17/2019 colonoscopy with mild diverticulosis in the sigmoid and descending colon and nonbleeding internal hemorrhoids.  Repeat recommended in 5 years given personal history of colon polyps.  Biopsies were negative.  He was started on Colestipol and doing well.  Refilled last 11/30/2019.    12/20/2020 CBC normal.    11/30/2021 CT of the chest without contrast done to examine lung nodule showed stable appearance of 2 left lower lobe pulmonary nodules measuring up to 3 mm as well as mildly nodular contour liver which could reflect underlying cirrhosis.    12/11/2021 patient seen in clinic by me after having a CT which had a question of cirrhosis.  At time continued on Colestipol 1 g twice a day for diarrhea.  At that time ordered right upper quadrant ultrasound and labs and refilled Colestipol.    12/16/2021 right upper quadrant ultrasound with nodular hepatic margins suggesting cirrhosis.    01/03/2022 ultrasound elastography of the liver with a median K PA 04.8.  Indicating that patient had a good probability of having a normal liver.    06/16/2022 patient went to [facility] and saw GI for his abnormal liver imaging.    07/28/2022 MRI of the abdomen with and without contrast with MR elastography of 2.12K PA.  Normal morphology of the liver.    11/28/2022 CBC with a hemoglobin normal at 13.5.,  Normal CMP and TSH.  Discussed some black stool/melena at the time he was referred  here.    Today, the patient presents to clinic accompanied by his wife, who assists with history.  He tells me that 3 to 4 weeks ago he had some dark stool and then 1 episode of a charcoal looking melenic stool.  He has not seen any since then but has noted that he has had a lot of excess gas in his system even after drinking just water and a lot of "churning" in there.  Describes a distant history of H. pylori and being treated with "multiple medicines"  and also question of an ulcer from NSAID use back about 5 to 10 years ago.  This was when he was following with Dr. Cristina Gong.  He has really had no issues since then.  He does not use NSAIDs anymore.  Not on a PPI.    Denies fever, chills, weight loss, nausea, vomiting or heartburn, reflux or abdominal pain.  Past Medical History:  Diagnosis Date   Colon polyps    Hypercholesteremia    Prostate cancer ([place]) 04/2016    Past Surgical History:  Procedure Laterality Date   PROSTATECTOMY  04/2016    Current Outpatient Medications  Medication Sig Dispense Refill   colestipol (COLESTID) 1 g tablet Take 1 tablet (1 g total) by mouth 2 (two) times daily. 60 tablet 1   Cyanocobalamin (B-12) 1000 MCG CAPS      ezetimibe-simvastatin (VYTORIN) 10-40 MG tablet Take 1 tablet by mouth at bedtime.  fluticasone (FLONASE) 50 MCG/ACT nasal spray Place into the nose as needed.     metoprolol tartrate (LOPRESSOR) 25 MG tablet Take 1 tablet by mouth up to twice daily only as needed 180 tablet 1   No current facility-administered medications for this visit.    Allergies as of 12/11/2022 - Review Complete 01/02/2022  Allergen Reaction Noted   Penicillins Rash 07/04/2012    Family History  Problem Relation Age of Onset   Heart attack Mother     Social History   Socioeconomic History   Marital status: Married    Spouse name: Not on file   Number of children: 0   Years of education: Not on file   Highest education level: Not on file  Occupational  History   Occupation: RN  Tobacco Use   Smoking status: Never   Smokeless tobacco: Never  Vaping Use   Vaping Use: Never used  Substance and Sexual Activity   Alcohol use: No   Drug use: No   Sexual activity: Not on file  Other Topics Concern   Not on file  Social History Narrative   Not on file   Social Determinants of Health   Financial Resource Strain: Not on file  Food Insecurity: Not on file  Transportation Needs: Not on file  Physical Activity: Not on file  Stress: Not on file  Social Connections: Not on file  Intimate Partner Violence: Not on file    Review of Systems:    Constitutional: No weight loss, fever or chills Cardiovascular: No chest pain  Respiratory: No SOB  Gastrointestinal: See HPI and otherwise negative   Physical Exam:  Vital signs: BP 124/66   Pulse 75   Ht '5\' 7"'$  (1.702 m)   Wt 144 lb (65.3 kg)   BMI 22.55 kg/m    Constitutional:   Pleasant male appears to be in NAD, Well developed, Well nourished, alert and cooperative Head:  Normocephalic and atraumatic. Eyes:   PEERL, EOMI. No icterus. Conjunctiva pink. Ears:  Normal auditory acuity. Neck:  Supple Throat: Oral cavity and pharynx without inflammation, swelling or lesion.  Respiratory: Respirations even and unlabored. Lungs clear to auscultation bilaterally.   No wheezes, crackles, or rhonchi.  Cardiovascular: Normal S1, S2. No MRG. Regular rate and rhythm. No peripheral edema, cyanosis or pallor.  Gastrointestinal:  Soft, nondistended, nontender. No rebound or guarding. Normal bowel sounds. No appreciable masses or hepatomegaly. Rectal:  Not performed.  Msk:  Symmetrical without gross deformities. Without edema, no deformity or joint abnormality.  Neurologic:  Alert and  oriented x4;  grossly normal neurologically.  Skin:   Dry and intact without significant lesions or rashes. Psychiatric: Demonstrates good judgement and reason without abnormal affect or behaviors.  RELEVANT LABS AND  IMAGING: CBC    Component Value Date/Time   WBC 5.4 12/11/2021 1121   RBC 4.75 12/11/2021 1121   HGB 14.5 12/11/2021 1121   HGB 14.3 12/20/2020 1208   HCT 43.3 12/11/2021 1121   HCT 41.7 12/20/2020 1208   PLT 246.0 12/11/2021 1121   PLT 239 12/20/2020 1208   MCV 91.3 12/11/2021 1121   MCV 90 12/20/2020 1208   MCH 30.9 12/20/2020 1208   MCH 30.3 10/21/2010 1255   MCHC 33.5 12/11/2021 1121   RDW 13.7 12/11/2021 1121   RDW 12.5 12/20/2020 1208   LYMPHSABS 1.6 12/11/2021 1121   LYMPHSABS 1.5 12/20/2020 1208   MONOABS 0.4 12/11/2021 1121   EOSABS 0.3 12/11/2021 1121   EOSABS 0.3 12/20/2020  1208   BASOSABS 0.1 12/11/2021 1121   BASOSABS 0.1 12/20/2020 1208    CMP     Component Value Date/Time   NA 137 12/11/2021 1121   NA 139 12/20/2020 1208   K 4.6 12/11/2021 1121   CL 102 12/11/2021 1121   CO2 29 12/11/2021 1121   GLUCOSE 80 12/11/2021 1121   BUN 11 12/11/2021 1121   BUN 9 12/20/2020 1208   CREATININE 0.93 12/11/2021 1121   CALCIUM 9.4 12/11/2021 1121   PROT 7.1 12/11/2021 1121   ALBUMIN 4.8 12/11/2021 1121   AST 22 12/11/2021 1121   ALT 23 12/11/2021 1121   ALKPHOS 36 (L) 12/11/2021 1121   BILITOT 1.0 12/11/2021 1121   GFRNONAA >60 10/21/2010 1255   GFRAA  10/21/2010 1255    >60        The eGFR has been calculated using the MDRD equation. This calculation has not been validated in all clinical situations. eGFR's persistently <60 mL/min signify possible Chronic Kidney Disease.    Assessment: 1.  Dark stool/melena: 1 episode of true melena per patient 3 to 4 weeks ago, labs since then with his PCP do not show drop in hemoglobin, some increased gas in his system but otherwise normal, history of H. pylori treated years ago; consider H. pylori gastritis +/- PUD versus other 2.  History of H. pylori 3.  [place]  Plan: 1.  Scheduled patient for diagnostic EGD in the [place] with Dr. Silverio Decamp.  Did provide the patient a detailed list risks for the procedure and  he agrees to proceed. Patient is appropriate for endoscopic procedure(s) in the ambulatory ([facility]) setting.   2.  Patient does not like using medication that he does not have to be on.  We will wait to start a PPI until results of EGD. 3.  Patient to follow in clinic per recommendations from Dr. Silverio Decamp after time of procedure.  Ellouise Newer, PA-C [facility] 12/11/2022, 1:31 PM  Cc: Lujean Amel, MD

## 2022-12-11 NOTE — Patient Instructions (Addendum)
_______________________________________________________  If your blood pressure at your visit was 140/90 or greater, please contact your primary care physician to follow up on this.  _______________________________________________________  If you are age 68 or older, your body mass index should be between 23-30. Your Body mass index is 22.55 kg/m. If this is out of the aforementioned range listed, please consider follow up with your Primary Care Provider.  If you are age 5 or younger, your body mass index should be between 19-25. Your Body mass index is 22.55 kg/m. If this is out of the aformentioned range listed, please consider follow up with your Primary Care Provider.   ________________________________________________________  The [facility] providers would like to encourage you to use [facility] to communicate with providers for non-urgent requests or questions.  Due to long hold times on the telephone, sending your provider a message by [facility] may be a faster and more efficient way to get a response.  Please allow 48 business hours for a response.  Please remember that this is for non-urgent requests.  _______________________________________________________  Karl [name] have been scheduled for an endoscopy. Please follow written instructions given to you at your visit today. If you use inhalers (even only as needed), please bring them with you on the day of your procedure.  Please call with any questions or concerns.  It was a pleasure to see you today!  Thank you for trusting me with your gastrointestinal care!

## 2022-12-17 ENCOUNTER — Ambulatory Visit (HOSPITAL_BASED_OUTPATIENT_CLINIC_OR_DEPARTMENT_OTHER): Payer: Medicare Other | Admitting: Pulmonary Disease

## 2022-12-18 ENCOUNTER — Other Ambulatory Visit: Payer: Self-pay

## 2022-12-18 MED ORDER — EZETIMIBE-SIMVASTATIN 10-40 MG PO TABS: 1.0000 | ORAL_TABLET | Freq: Every day | ORAL | 0 refills | Status: DC

## 2022-12-18 MED ORDER — METOPROLOL TARTRATE 25 MG PO TABS: ORAL_TABLET | ORAL | 0 refills | Status: AC

## 2022-12-18 NOTE — Telephone Encounter (Signed)
n

## 2022-12-18 NOTE — Telephone Encounter (Signed)
Pt's medications were sent to pt's pharmacy as requested. Confirmation received.  

## 2022-12-19 ENCOUNTER — Ambulatory Visit
Admission: RE | Admit: 2022-12-19 | Discharge: 2022-12-19 | Disposition: A | Discharge status: Home / Self Care | Payer: Medicare Other | Source: Ambulatory Visit | Attending: Pulmonary Disease | Admitting: Pulmonary Disease

## 2022-12-19 DIAGNOSIS — I3139 Other pericardial effusion (noninflammatory): Secondary | ICD-10-CM | POA: Diagnosis not present

## 2022-12-19 DIAGNOSIS — R918 Other nonspecific abnormal finding of lung field: Secondary | ICD-10-CM | POA: Diagnosis not present

## 2022-12-19 DIAGNOSIS — R911 Solitary pulmonary nodule: Secondary | ICD-10-CM | POA: Diagnosis not present

## 2022-12-19 DIAGNOSIS — I700 Atherosclerosis of aorta: Secondary | ICD-10-CM | POA: Diagnosis not present

## 2022-12-24 ENCOUNTER — Ambulatory Visit (AMBULATORY_SURGERY_CENTER): Payer: Medicare Other | Admitting: Gastroenterology

## 2022-12-24 ENCOUNTER — Encounter: Payer: Self-pay | Admitting: Gastroenterology

## 2022-12-24 ENCOUNTER — Other Ambulatory Visit: Payer: Self-pay

## 2022-12-24 ENCOUNTER — Encounter (HOSPITAL_BASED_OUTPATIENT_CLINIC_OR_DEPARTMENT_OTHER): Payer: Self-pay | Admitting: Pulmonary Disease

## 2022-12-24 ENCOUNTER — Other Ambulatory Visit: Payer: Medicare Other

## 2022-12-24 ENCOUNTER — Ambulatory Visit (HOSPITAL_BASED_OUTPATIENT_CLINIC_OR_DEPARTMENT_OTHER): Payer: Medicare Other | Admitting: Pulmonary Disease

## 2022-12-24 VITALS — BP 126/76 | HR 68 | Temp 98.1°F | Ht 67.0 in | Wt 146.4 lb

## 2022-12-24 VITALS — BP 134/77 | HR 60 | Temp 97.1°F | Resp 12 | Ht 67.0 in | Wt 144.0 lb

## 2022-12-24 DIAGNOSIS — K2970 Gastritis, unspecified, without bleeding: Secondary | ICD-10-CM

## 2022-12-24 DIAGNOSIS — K2990 Gastroduodenitis, unspecified, without bleeding: Secondary | ICD-10-CM

## 2022-12-24 DIAGNOSIS — R918 Other nonspecific abnormal finding of lung field: Secondary | ICD-10-CM

## 2022-12-24 DIAGNOSIS — K921 Melena: Secondary | ICD-10-CM

## 2022-12-24 HISTORY — PX: UPPER GASTROINTESTINAL ENDOSCOPY: SHX188

## 2022-12-24 MED ORDER — SODIUM CHLORIDE 0.9 % IV SOLN: 500.0000 mL | Freq: Once | INTRAVENOUS | Status: DC

## 2022-12-24 MED ORDER — COLESTIPOL HCL 1 G PO TABS: 1.0000 g | ORAL_TABLET | Freq: Two times a day (BID) | ORAL | 1 refills | Status: DC

## 2022-12-24 MED ORDER — PANTOPRAZOLE SODIUM 40 MG PO TBEC: 40.0000 mg | DELAYED_RELEASE_TABLET | Freq: Every day | ORAL | 0 refills | Status: AC

## 2022-12-24 NOTE — Op Note (Signed)
[place] [facility] Patient Name: Karl [name] Procedure Date: 12/24/2022 1:20 PM MRN: [id number] Endoscopist: Mauri Pole , MD, [id number] Age: 67 Referring MD:  Date of Birth: 01/27/1956 Gender: Male Account #: [id number] Procedure:                Upper GI endoscopy Indications:              Melena Medicines:                Monitored Anesthesia Care Procedure:                Pre-Anesthesia Assessment:                           - Prior to the procedure, a History and Physical                            was performed, and patient medications and                            allergies were reviewed. The patient's tolerance of                            previous anesthesia was also reviewed. The risks                            and benefits of the procedure and the sedation                            options and risks were discussed with the patient.                            All questions were answered, and informed consent                            was obtained. Prior Anticoagulants: The patient has                            taken no anticoagulant or antiplatelet agents. ASA                            Grade Assessment: II - A patient with mild systemic                            disease. After reviewing the risks and benefits,                            the patient was deemed in satisfactory condition to                            undergo the procedure.                           After obtaining informed consent, the endoscope was  passed under direct vision. Throughout the                            procedure, the patient's blood pressure, pulse, and                            oxygen saturations were monitored continuously. The                            GIF [id number] was introduced through the                            mouth, and advanced to the second part of duodenum.                            The upper GI endoscopy was accomplished  without                            difficulty. The patient tolerated the procedure                            well. Scope In: Scope Out: Findings:                 The Z-line was regular and was found 38 cm from the                            incisors.                           The examined esophagus was normal.                           Patchy mild inflammation characterized by                            congestion (edema), erosions and erythema was found                            in the gastric antrum and in the prepyloric region                            of the stomach. Biopsies were taken with a cold                            forceps for histology. Biopsies were taken with a                            cold forceps for Helicobacter pylori testing.                           The cardia and gastric fundus were normal on                            retroflexion.  The examined duodenum was normal. Complications:            No immediate complications. Estimated Blood Loss:     Estimated blood loss was minimal. Impression:               - Z-line regular, 38 cm from the incisors.                           - Normal esophagus.                           - Gastritis. Biopsied.                           - Normal examined duodenum. Recommendation:           - Patient has a contact number available for                            emergencies. The signs and symptoms of potential                            delayed complications were discussed with the                            patient. Return to normal activities tomorrow.                            Written discharge instructions were provided to the                            patient.                           - Resume previous diet.                           - Continue present medications.                           - Await pathology results.                           - Use Protonix (pantoprazole) 40 mg PO daily for 3                             months.                           - No ibuprofen, naproxen, or other non-steroidal                            anti-inflammatory drugs. Mauri Pole, MD 12/24/2022 1:52:22 PM This report has been signed electronically.

## 2022-12-24 NOTE — Progress Notes (Unsigned)
Please refer to office visit note 12/11/22 by Karl [name]. No additional changes in H&P Patient is appropriate for planned procedure(s) and anesthesia in an ambulatory setting  K. Denzil Magnuson , MD [phone]

## 2022-12-24 NOTE — Patient Instructions (Signed)
Multiple subcentimeter nodules - stable 2 years No further imaging indicated

## 2022-12-24 NOTE — Progress Notes (Unsigned)
Pt's states no medical or surgical changes since previsit or office visit. 

## 2022-12-24 NOTE — Progress Notes (Unsigned)
Subjective:   PATIENT ID: Karl [name] GENDER: male DOB: 07-01-1956, MRN: [id number]   HPI  Chief Complaint  Patient presents with   Follow-up    Follow up. No complaints.     Reason for Visit: Follow-up  [name] is a 67 year old male never smoker with hx prostate cancer and HLD who presents for follow-up  Synopsis: He had a CT coronary scan in March 2022 which demonstrated two solid left lower lobe nodules measuring <26mm nodules. With his history of prostate cancer, he is seeking advice regarding monitoring of his lung nodules. At this time, he denies any recent unexplained fevers/chills, denies unintentional weight loss, denies night sweats. No respiratory symptoms, denies cough/shortness of breath/wheezing. He grew up in a house with a wood burning stove. He has worked in health care. Does change car brakes every four years. Otherwise no environmental exposures.  12/11/21 He presents for follow-up for his recent CT scan. Denies recent unexplained fevers, chills, unintentional weight loss, night sweats.  No respiratory symptoms.  Denies cough, shortness of breath, wheezing.  12/25/22 Presents with wife for follow-up for his CT scan. Denies unexplained fevers, chills, unintentional weight loss or night sweats. Denies cough, shortness of breath or wheezing.  Past Medical History:  Diagnosis Date   Colon polyps    Hypercholesteremia    Prostate cancer ([place]) 04/2016     Family History  Problem Relation Age of Onset   Heart attack Mother      Social History   Occupational History   Occupation: Therapist, sports  Tobacco Use   Smoking status: Never   Smokeless tobacco: Never  Vaping Use   Vaping Use: Never used  Substance and Sexual Activity   Alcohol use: No   Drug use: No   Sexual activity: Not on file    Allergies  Allergen Reactions   Penicillins Rash     Outpatient Medications Prior to Visit  Medication Sig Dispense Refill   Azelastine HCl 137 MCG/SPRAY  SOLN Place 1 spray into both nostrils 2 (two) times daily.     colestipol (COLESTID) 1 g tablet Take 1 tablet (1 g total) by mouth 2 (two) times daily. 60 tablet 1   Cyanocobalamin (B-12) 1000 MCG CAPS      ezetimibe-simvastatin (VYTORIN) 10-40 MG tablet Take 1 tablet by mouth at bedtime. 90 tablet 0   fluticasone (FLONASE) 50 MCG/ACT nasal spray Place into the nose as needed.     hydrocortisone 2.5 % cream SMARTSIG:Sparingly Topical Twice Daily PRN     levocetirizine (XYZAL) 5 MG tablet SMARTSIG:1 Tablet(s) By Mouth Every Evening     metoprolol tartrate (LOPRESSOR) 25 MG tablet Take 1 tablet by mouth up to twice daily only as needed 180 tablet 0   No facility-administered medications prior to visit.    Review of Systems  Constitutional:  Negative for chills, diaphoresis, fever, malaise/fatigue and weight loss.  HENT:  Negative for congestion.   Respiratory:  Negative for cough, hemoptysis, sputum production, shortness of breath and wheezing.   Cardiovascular:  Negative for chest pain, palpitations and leg swelling.     Objective:   Vitals:   12/24/22 0924  BP: 126/76  Pulse: 68  Temp: 98.1 F (36.7 C)  TempSrc: Oral  SpO2: 100%  Weight: 146 lb 6.4 oz (66.4 kg)  Height: 5\' 7"  (1.702 m)   SpO2: 100 % O2 Device: None (Room air)  Physical Exam: General: Well-appearing, no acute distress HENT: , AT Eyes: EOMI, no  scleral icterus Respiratory: Clear to auscultation bilaterally.  No crackles, wheezing or rales Cardiovascular: RRR, -M/R/G, no JVD Extremities:-Edema,-tenderness Neuro: AAO x4, CNII-XII grossly intact Psych: Normal mood, normal affect  Data Reviewed:  Imaging: CT Coronary 12/27/20 - two left lower lobe lung nodules with largest 79mm CT chest 11/29/2021-unchanged left lower lobe pulmonary nodules measured 3 x 2 mm and 4 x 2 mm.  Right apical nodule measured 2 mm, previously outside of the view CT coronary. CT Chest 12/19/22 - stable subcentimeter nodules up in LLL  with largest 4 mm  PFT: None on file  Labs: CBC    Component Value Date/Time   WBC 5.4 12/11/2021 1121   RBC 4.75 12/11/2021 1121   HGB 14.5 12/11/2021 1121   HGB 14.3 12/20/2020 1208   HCT 43.3 12/11/2021 1121   HCT 41.7 12/20/2020 1208   PLT 246.0 12/11/2021 1121   PLT 239 12/20/2020 1208   MCV 91.3 12/11/2021 1121   MCV 90 12/20/2020 1208   MCH 30.9 12/20/2020 1208   MCH 30.3 10/21/2010 1255   MCHC 33.5 12/11/2021 1121   RDW 13.7 12/11/2021 1121   RDW 12.5 12/20/2020 1208   LYMPHSABS 1.6 12/11/2021 1121   LYMPHSABS 1.5 12/20/2020 1208   MONOABS 0.4 12/11/2021 1121   EOSABS 0.3 12/11/2021 1121   EOSABS 0.3 12/20/2020 1208   BASOSABS 0.1 12/11/2021 1121   BASOSABS 0.1 12/20/2020 1208   Absolute eso 12/20/20 - 300    Assessment & Plan:   Discussion: 67 year old male never smoker with hx of prostate cancer and subcentimeter pulmonary nodules who presents for follow-up. Low risk for malignancy. Counseled that based on the stability of his scans, nodule sizes and risk factors, no further imaging needed. Patient expressed understanding and agreed to plan.  Multiple subcentimeter nodules, benign - stable 2 years No further imaging indicated  Health Maintenance Immunization History  Administered Date(s) Administered   Influenza Inj Mdck Quad Pf 08/01/2020   Influenza Split 06/27/2015, 08/02/2020   Influenza, High Dose Seasonal PF 07/25/2021   Influenza,inj,Quad PF,6+ Mos 06/27/2018, 06/04/2019   Influenza-Unspecified 07/11/2017   PFIZER(Purple Top)SARS-COV-2 Vaccination 09/24/2019, 10/14/2019, 07/06/2020   Tdap 05/11/2009, 01/31/2020   Zoster Recombinat (Shingrix) 07/08/2018   Zoster, Live 04/14/2018, 07/16/2018   CT Lung Screen - not indicated. Never smoker  No orders of the defined types were placed in this encounter. No orders of the defined types were placed in this encounter.  Return if symptoms worsen or fail to improve.  I have spent a total time of  20-minutes on the day of the appointment including chart review, data review, collecting history, coordinating care and discussing medical diagnosis and plan with the patient/family. Past medical history, allergies, medications were reviewed. Pertinent imaging, labs and tests included in this note have been reviewed and interpreted independently by me.  White Salmon, MD [facility] 12/24/2022 9:34 AM  Office Number [phone]

## 2022-12-24 NOTE — Patient Instructions (Addendum)
Resume previous diet Continue present medications, begin taking Protonix 40 mg once daily for 3 months No NSAIDS (Non-Steroidal anti-inflammatory drugs).  (These include, aspirin, aspirin-containing products, ibuprofen, advil, motrin, naproxen, aleve, goody powders, etc) Tylenol is ok to take as needed, see label for instructions. Await pathology results Information given on Gastritis  YOU HAD AN ENDOSCOPIC PROCEDURE TODAY AT [facility]:   Refer to the procedure report that was given to you for any specific questions about what was found during the examination.  If the procedure report does not answer your questions, please call your gastroenterologist to clarify.  If you requested that your care partner not be given the details of your procedure findings, then the procedure report has been included in a sealed envelope for you to review at your convenience later.  YOU SHOULD EXPECT: Some feelings of bloating in the abdomen. Passage of more gas than usual.  Walking can help get rid of the air that was put into your GI tract during the procedure and reduce the bloating. If you had a lower endoscopy (such as a colonoscopy or flexible sigmoidoscopy) you may notice spotting of blood in your stool or on the toilet paper. If you underwent a bowel prep for your procedure, you may not have a normal bowel movement for a few days.  Please Note:  You might notice some irritation and congestion in your nose or some drainage.  This is from the oxygen used during your procedure.  There is no need for concern and it should clear up in a day or so.  SYMPTOMS TO REPORT IMMEDIATELY:  Following upper endoscopy (EGD)  Vomiting of blood or coffee ground material  New chest pain or pain under the shoulder blades  Painful or persistently difficult swallowing  New shortness of breath  Fever of 100F or higher  Black, tarry-looking stools  For urgent or emergent issues, a gastroenterologist can be  reached at any hour by calling [phone]. Do not use MyChart messaging for urgent concerns.   DIET:  We do recommend a small meal at first, but then you may proceed to your regular diet.  Drink plenty of fluids but you should avoid alcoholic beverages for 24 hours.  ACTIVITY:  You should plan to take it easy for the rest of today and you should NOT DRIVE or use heavy machinery until tomorrow (because of the sedation medicines used during the test).    FOLLOW UP: Our staff will call the number listed on your records the next business day following your procedure.  We will call around 7:15- 8:00 am to check on you and address any questions or concerns that you may have regarding the information given to you following your procedure. If we do not reach you, we will leave a message.     If any biopsies were taken you will be contacted by phone or by letter within the next 1-3 weeks.  Please call us at [phone] if you have not heard about the biopsies in 3 weeks.    SIGNATURES/CONFIDENTIALITY: You and/or your care partner have signed paperwork which will be entered into your electronic medical record.  These signatures attest to the fact that that the information above on your After Visit Summary has been reviewed and is understood.  Full responsibility of the confidentiality of this discharge information lies with you and/or your care-partner.

## 2022-12-24 NOTE — Progress Notes (Unsigned)
Called to room to assist during endoscopic procedure.  Patient ID and intended procedure confirmed with present staff. Received instructions for my participation in the procedure from the performing physician.  

## 2022-12-24 NOTE — Telephone Encounter (Signed)
Colestipol refilled as [facility] requested. Dewaun is up to date on his visits and had procedure today and Dr Silverio Decamp said to continue current meds.

## 2022-12-24 NOTE — Progress Notes (Signed)
Uneventful anesthetic. Report to pacu rn. Vss. Care resumed by rn. 

## 2022-12-25 ENCOUNTER — Encounter (HOSPITAL_BASED_OUTPATIENT_CLINIC_OR_DEPARTMENT_OTHER): Payer: Self-pay | Admitting: Pulmonary Disease

## 2022-12-25 ENCOUNTER — Telehealth: Payer: Self-pay

## 2022-12-25 NOTE — Telephone Encounter (Signed)
Follow up call to pt, lm for pt to call if having any difficulty with normal activities or eating and drinking.  Also to call if any other questions or concerns.  

## 2023-01-01 DIAGNOSIS — K080 Exfoliation of teeth due to systemic causes: Secondary | ICD-10-CM | POA: Diagnosis not present

## 2023-01-07 ENCOUNTER — Encounter: Payer: Self-pay | Admitting: Gastroenterology

## 2023-01-08 ENCOUNTER — Ambulatory Visit
Admission: RE | Admit: 2023-01-08 | Discharge: 2023-01-08 | Disposition: A | Discharge status: Home / Self Care | Payer: Medicare Other | Source: Ambulatory Visit | Attending: Family Medicine | Admitting: Family Medicine

## 2023-01-08 DIAGNOSIS — R221 Localized swelling, mass and lump, neck: Secondary | ICD-10-CM

## 2023-01-13 NOTE — Progress Notes (Signed)
Reviewed and agree with documentation and assessment and plan. K. Veena Avrian Delfavero , MD   

## 2023-01-29 ENCOUNTER — Telehealth: Payer: Self-pay | Admitting: Cardiovascular Disease

## 2023-01-29 MED ORDER — EZETIMIBE-SIMVASTATIN 10-40 MG PO TABS: 1.0000 | ORAL_TABLET | Freq: Every day | ORAL | 0 refills | Status: DC

## 2023-01-29 NOTE — Telephone Encounter (Signed)
*  STAT* If patient is at the pharmacy, call can be transferred to refill team.   1. Which medications need to be refilled? (please list name of each medication and dose if known)   ezetimibe-simvastatin (VYTORIN) 10-40 MG tablet    2. Which pharmacy/location (including street and city if local pharmacy) is medication to be sent to? [facility] - [place], [place] - [address]   3. Do they need a 30 day or 90 day supply? 90 day

## 2023-01-29 NOTE — Telephone Encounter (Signed)
Pt' medication was sent to pt's pharmacy as requested. Confirmation received.  

## 2023-02-16 ENCOUNTER — Ambulatory Visit: Payer: Medicare Other | Attending: Cardiovascular Disease | Admitting: Cardiovascular Disease

## 2023-02-16 ENCOUNTER — Encounter: Payer: Self-pay | Admitting: Cardiovascular Disease

## 2023-02-16 VITALS — BP 150/88 | HR 70 | Ht 67.0 in | Wt 142.0 lb

## 2023-02-16 DIAGNOSIS — R002 Palpitations: Secondary | ICD-10-CM | POA: Diagnosis not present

## 2023-02-16 DIAGNOSIS — R030 Elevated blood-pressure reading, without diagnosis of hypertension: Secondary | ICD-10-CM | POA: Diagnosis not present

## 2023-02-16 DIAGNOSIS — E782 Mixed hyperlipidemia: Secondary | ICD-10-CM

## 2023-02-16 NOTE — Patient Instructions (Signed)
Medication Instructions:  Your physician recommends that you continue on your current medications as directed. Please refer to the Current Medication list given to you today.  *If you need a refill on your cardiac medications before your next appointment, please call your pharmacy*  Lab Work: None ordered today.  Testing/Procedures: None ordered today.  Follow-Up: At [facility], you and your health needs are our priority.  As part of our continuing mission to provide you with exceptional heart care, we have created designated Provider Care Teams.  These Care Teams include your primary Cardiologist (physician) and Advanced Practice Providers (APPs -  Physician Assistants and Nurse Practitioners) who all work together to provide you with the care you need, when you need it.  Your next appointment:   1 year(s)  The format for your next appointment:   In Person  Provider:   Tonny Bollman, MD {

## 2023-02-16 NOTE — Progress Notes (Signed)
Cardiology Office Note:    Date:  02/16/2023   ID:  Karl [name], DOB 1955-10-07, MRN [id number]  PCP:  Darrow Bussing, MD   [facility] Providers Cardiologist:  Tonny Bollman, MD     Referring MD: Darrow Bussing, MD   No chief complaint on file.   History of Present Illness:    Karl [name] is a 67 y.o. male with a hx of heart palpitations and mixed hyperlipidemia, presenting for follow-up evaluation.  The patient has been seen on an annual basis.  He has undergone cardiac testing in the past that includes a gated coronary CTA which showed a coronary calcium score of 0 and no evidence of coronary atherosclerosis.  He has undergone outpatient monitoring, demonstrating few short runs of SVT, but normal sinus rhythm with a low PVC burden of less than 1%.  The patient is here alone today.  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  He plans to take some time off this summer before looking for other employment.  The patient has had some recent heart palpitations when working outside in the garden.  He denies any chest pain, chest pressure, or shortness of breath.  He otherwise feels well.  He denies edema, orthopnea, or PND.  He reports home blood pressures generally ranging around 110 to 120 mmHg.  Past Medical History:  Diagnosis Date   Colon polyps    Hypercholesteremia    Prostate cancer (HCC) 04/2016    Past Surgical History:  Procedure Laterality Date   PROSTATECTOMY  04/2016   UPPER GASTROINTESTINAL ENDOSCOPY  12/24/2022    Current Medications: Current Meds  Medication Sig   Azelastine HCl 137 MCG/SPRAY SOLN Place 1 spray into both nostrils 2 (two) times daily.   colestipol (COLESTID) 1 g tablet Take 1 tablet (1 g total) by mouth 2 (two) times daily.   Cyanocobalamin (B-12) 1000 MCG CAPS    ezetimibe-simvastatin (VYTORIN) 10-40 MG tablet Take 1 tablet by mouth at bedtime.   fluticasone (FLONASE) 50 MCG/ACT nasal spray  Place into the nose as needed.   hydrocortisone 2.5 % cream SMARTSIG:Sparingly Topical Twice Daily PRN   levocetirizine (XYZAL) 5 MG tablet SMARTSIG:1 Tablet(s) By Mouth Every Evening   metoprolol tartrate (LOPRESSOR) 25 MG tablet Take 1 tablet by mouth up to twice daily only as needed   pantoprazole (PROTONIX) 40 MG tablet Take 1 tablet (40 mg total) by mouth daily.     Allergies:   Penicillins   Social History   Socioeconomic History   Marital status: Married    Spouse name: Not on file   Number of children: 0   Years of education: Not on file   Highest education level: Not on file  Occupational History   Occupation: RN  Tobacco Use   Smoking status: Never   Smokeless tobacco: Never  Vaping Use   Vaping Use: Never used  Substance and Sexual Activity   Alcohol use: No   Drug use: No   Sexual activity: Not on file  Other Topics Concern   Not on file  Social History Narrative   Not on file   Social Determinants of Health   Financial Resource Strain: Not on file  Food Insecurity: Not on file  Transportation Needs: Not on file  Physical Activity: Not on file  Stress: Not on file  Social Connections: Not on file     Family History: The patient's family history includes Heart attack in his mother.  ROS:   Please see the history of present illness.    All other systems reviewed and are negative.  EKGs/Labs/Other Studies Reviewed:    The following studies were reviewed today: Cardiac Studies & Procedures       ECHOCARDIOGRAM  ECHOCARDIOGRAM COMPLETE 01/16/2022  Narrative ECHOCARDIOGRAM REPORT    Patient Name:   Karl [name] Date of Exam: 01/16/2022 Medical Rec #:  [id number]     Height:       67.0 in Accession #:    [id number]    Weight:       143.0 lb Date of Birth:  April 05, 1956     BSA:          1.754 m Patient Age:    66 years      BP:           134/76 mmHg Patient Gender: M             HR:           67 bpm. Exam Location:  [address]  Procedure:  3D Echo, 2D Echo, Cardiac Doppler, Color Doppler and Strain Analysis  Indications:    R00.2 Palpitation  History:        Patient has no prior history of Echocardiogram examinations. Arrythmias:Palpitation; Risk Factors:Dyslipidemia.  Sonographer:    Jorje Guild BS, RDCS Referring Phys: [phone] Martiza Speth  IMPRESSIONS   1. Left ventricular ejection fraction, by estimation, is 60 to 65%. Left ventricular ejection fraction by 3D volume is 57 %. The left ventricle has normal function. The left ventricle has no regional wall motion abnormalities. Left ventricular diastolic parameters were normal. 2. Right ventricular systolic function is normal. The right ventricular size is normal. 3. The mitral valve is normal in structure. No evidence of mitral valve regurgitation. No evidence of mitral stenosis. 4. The aortic valve is normal in structure. Aortic valve regurgitation is not visualized. No aortic stenosis is present. 5. The inferior vena cava is normal in size with greater than 50% respiratory variability, suggesting right atrial pressure of 3 mmHg.  FINDINGS Left Ventricle: Left ventricular ejection fraction, by estimation, is 60 to 65%. Left ventricular ejection fraction by 3D volume is 57 %. The left ventricle has normal function. The left ventricle has no regional wall motion abnormalities. The left ventricular internal cavity size was normal in size. There is no left ventricular hypertrophy. Left ventricular diastolic parameters were normal.  Right Ventricle: The right ventricular size is normal. No increase in right ventricular wall thickness. Right ventricular systolic function is normal.  Left Atrium: Left atrial size was normal in size.  Right Atrium: Right atrial size was normal in size.  Pericardium: There is no evidence of pericardial effusion.  Mitral Valve: The mitral valve is normal in structure. No evidence of mitral valve regurgitation. No evidence of mitral valve  stenosis.  Tricuspid Valve: The tricuspid valve is normal in structure. Tricuspid valve regurgitation is trivial. No evidence of tricuspid stenosis.  Aortic Valve: The aortic valve is normal in structure. Aortic valve regurgitation is not visualized. Aortic regurgitation PHT measures 209 msec. No aortic stenosis is present.  Pulmonic Valve: The pulmonic valve was normal in structure. Pulmonic valve regurgitation is not visualized. No evidence of pulmonic stenosis.  Aorta: The aortic root is normal in size and structure.  Venous: The inferior vena cava is normal in size with greater than 50% respiratory variability, suggesting right atrial pressure of 3 mmHg.  IAS/Shunts: No atrial level shunt detected by color  flow Doppler.   LEFT VENTRICLE PLAX 2D LVIDd:         4.40 cm         Diastology LVIDs:         2.80 cm         LV e' medial:    10.10 cm/s LV PW:         0.90 cm         LV E/e' medial:  8.2 LV IVS:        0.90 cm         LV e' lateral:   14.00 cm/s LVOT diam:     2.00 cm         LV E/e' lateral: 5.9 LV SV:         77 LV SV Index:   44              2D LVOT Area:     3.14 cm        Longitudinal Strain 2D Strain GLS  -16.1 % (A2C): 2D Strain GLS  -20.5 % (A3C): 2D Strain GLS  -22.8 % (A4C): 2D Strain GLS  -19.8 % Avg:  3D Volume EF LV 3D EF:    Left ventricul ar ejection fraction by 3D volume is 57 %.  3D Volume EF: 3D EF:        57 % LV EDV:       118 ml LV ESV:       51 ml LV SV:        67 ml  RIGHT VENTRICLE             IVC RV Basal diam:  4.10 cm     IVC diam: 1.80 cm RV Mid diam:    3.40 cm RV S prime:     10.30 cm/s TAPSE (M-mode): 2.2 cm  LEFT ATRIUM             Index        RIGHT ATRIUM           Index LA diam:        3.30 cm 1.88 cm/m   RA Pressure: 3.00 mmHg LA Vol (A2C):   31.9 ml 18.19 ml/m  RA Area:     20.20 cm LA Vol (A4C):   24.5 ml 13.97 ml/m  RA Volume:   64.10 ml  36.55 ml/m LA Biplane Vol: 29.0 ml 16.54 ml/m AORTIC  VALVE LVOT Vmax:   114.00 cm/s LVOT Vmean:  73.400 cm/s LVOT VTI:    0.244 m AI PHT:      209 msec  AORTA Ao Root diam: 2.90 cm Ao Asc diam:  2.90 cm  MITRAL VALVE               TRICUSPID VALVE Estimated RAP:  3.00 mmHg MV Decel Time: 208 msec MV E velocity: 82.80 cm/s  SHUNTS MV A velocity: 70.20 cm/s  Systemic VTI:  0.24 m MV E/A ratio:  1.18        Systemic Diam: 2.00 cm  Kardie Tobb DO Electronically signed by Thomasene Ripple DO Signature Date/Time: 01/16/2022/2:58:16 PM    Final    MONITORS  LONG TERM MONITOR (3-14 DAYS) 02/13/2022  Narrative Patch Wear Time:  3 days and 14 hours (2023-05-01T10:57:08-399 to 2023-05-05T01:56:45-0400)  Patient had a min HR of 50 bpm, max HR of 174 bpm, and avg HR of 70 bpm. Predominant underlying rhythm was Sinus Rhythm. 7 Supraventricular Tachycardia runs occurred, the  run with the fastest interval lasting 7 beats with a max rate of 174 bpm, the longest lasting 11.6 secs with an avg rate of 156 bpm. Supraventricular Tachycardia was detected within +/- 45 seconds of symptomatic patient event(s). Isolated SVEs were rare (<1.0%), SVE Couplets were rare (<1.0%), and SVE Triplets were rare (<1.0%). Isolated VEs were rare (<1.0%), and no VE Couplets or VE Triplets were present.  The basic rhythm is normal sinus with an average HR of 70 bpm. There are rare PVC's, PAC's, and short supraventricular runs as outlined above. No AFib or flutter. No bradyarrhythmias.   CT SCANS  CT CORONARY MORPH W/CTA COR W/SCORE 12/28/2020  Addendum 12/28/2020 10:55 AM ADDENDUM REPORT: 12/28/2020 10:52  EXAM: OVER-READ INTERPRETATION  CT CHEST  The following report is an over-read performed by radiologist Dr. Lesia Hausen [facility] on 12/28/2020. This over-read does not include interpretation of cardiac or coronary anatomy or pathology. The coronary CTA interpretation by the cardiologist is attached.  COMPARISON:  None.  FINDINGS: Cardiovascular:  Normal heart size. No significant pericardial effusion/thickening. Great vessels are normal in course and caliber. No central pulmonary emboli.  Mediastinum/Nodes: Unremarkable esophagus. No pathologically enlarged mediastinal or hilar lymph nodes.  Lungs/Pleura: No pneumothorax. No pleural effusion. No acute consolidative airspace disease or lung masses. Two small solid anterior left lower lobe pulmonary nodules, largest 3 mm (series 12/image 7).  Upper abdomen: No acute abnormality.  Musculoskeletal: No aggressive appearing focal osseous lesions. Mild thoracic spondylosis.  IMPRESSION: Two small solid anterior left lower lobe pulmonary nodules, largest 3 mm. No follow-up needed if patient is low-risk (and has no known or suspected primary neoplasm). Non-contrast chest CT can be considered in 12 months if patient is high-risk. This recommendation follows the consensus statement: Guidelines for Management of Incidental Pulmonary Nodules Detected on CT Images: From the Fleischner Society 2017; Radiology 2017; 284:228-243.   Electronically Signed By: Delbert Phenix M.D. On: 12/28/2020 10:52  Narrative HISTORY: 67 yo male with ECG abnormal, 9yr CHD risk 10-20%, treadmill candidate  EXAM: Cardiac/Coronary CTA  TECHNIQUE: The patient was scanned on a [employer].  PROTOCOL: A 120 kV prospective scan was triggered in the descending thoracic aorta at 111 HU's. Axial non-contrast 3 mm slices were carried out through the heart. The data set was analyzed on a dedicated work station and scored using the Agatson method. Gantry rotation speed was 250 msecs and collimation was .6 mm. Beta blockade and 0.8 mg of sl NTG was given. The 3D data set was reconstructed in 5% intervals of the 67-82 % of the R-R cycle. Diastolic phases were analyzed on a dedicated work station using MPR, MIP and VRT modes. The patient received of contrast.  FINDINGS: Quality: Excellent, HR  65  Coronary calcium score: The patient's coronary artery calcium score is 0, which places the patient in the 0 percentile.  Coronary arteries: Normal coronary origins.  Right dominance.  Right Coronary Artery: Dominant.  No disease.  Left Main Coronary Artery: Normal. Bifurcates into the LAD and LCx arteries.  Left Anterior Descending Coronary Artery: Large anterior vessel that wraps around the apex. No disease. 2 small diagonal branches without stenosis.  Left Circumflex Artery: AV groove LCX without stenosis. There is a large high OM1 branch without disease.  Aorta: Normal size, 27 mm at the mid ascending aorta (level of the PA bifurcation) measured double oblique. Aortic atherosclerosis. No dissection.  Aortic Valve: Trileaflet.  Punctate calcifications.  Other findings:  Normal pulmonary vein  drainage into the left atrium.  Normal left atrial appendage without a thrombus.  Normal size of the pulmonary artery.  Punctate mitral annular calcification.  IMPRESSION: 1. No evidence of CAD, CADRADS = 0.  2. Coronary calcium score of 0. This was 0 percentile for age and sex matched control.  3. Normal coronary origin with right dominance.  4. Punctate aortic annular and mitral valvular calcification.  5. Aortic atherosclerosis.  6. Cardiac risk factor modification is recommended.  Electronically Signed: By: Chrystie Nose M.D. On: 12/27/2020 17:28           EKG:  EKG is ordered today.  The ekg ordered today demonstrates normal sinus rhythm 70 bpm, nonspecific ST/T wave abnormality  Recent Labs: No results found for requested labs within last 365 days.  Recent Lipid Panel No results found for: "CHOL", "TRIG", "HDL", "CHOLHDL", "VLDL", "LDLCALC", "LDLDIRECT"   Risk Assessment/Calculations:      HYPERTENSION CONTROL Vitals:   02/16/23 1337 02/16/23 1437  BP: (!) 144/86 (!) 150/88    The patient's blood pressure is elevated above target today.  In  order to address the patient's elevated BP: The blood pressure is usually elevated in clinic.  Blood pressures monitored at home have been optimal.            Physical Exam:    VS:  BP (!) 150/88   Pulse 70   Ht 5\' 7"  (1.702 m)   Wt 142 lb (64.4 kg)   SpO2 96%   BMI 22.24 kg/m     Wt Readings from Last 3 Encounters:  02/16/23 142 lb (64.4 kg)  12/24/22 144 lb (65.3 kg)  12/24/22 146 lb 6.4 oz (66.4 kg)     GEN:  Well nourished, well developed in no acute distress HEENT: Normal NECK: No JVD; No carotid bruits LYMPHATICS: No lymphadenopathy CARDIAC: RRR, no murmurs, rubs, gallops RESPIRATORY:  Clear to auscultation without rales, wheezing or rhonchi  ABDOMEN: Soft, non-tender, non-distended MUSCULOSKELETAL:  No edema; No deformity  SKIN: Warm and dry NEUROLOGIC:  Alert and oriented x 3 PSYCHIATRIC:  Normal affect   ASSESSMENT:    1. Palpitations   2. Mixed hyperlipidemia   3. Elevated BP without diagnosis of hypertension    PLAN:    In order of problems listed above:  Patient with benign heart palpitations, likely related to premature atrial contractions or supraventricular beats.  He will continue to use a beta-blocker as needed.  We discussed consideration of a scheduled dose beta-blocker, but favor an as needed approach.  He has a structurally normal heart, no coronary disease, and is otherwise healthy. Treated with ezetimibe and simvastatin.  Last lipids demonstrate cholesterol 153, HDL 61, LDL 79. His blood pressure generally runs in a very good range.  I suspect this is situational.  He will monitor.  No indication to start antihypertensive medication.           Medication Adjustments/Labs and Tests Ordered: Current medicines are reviewed at length with the patient today.  Concerns regarding medicines are outlined above.  No orders of the defined types were placed in this encounter.  No orders of the defined types were placed in this  encounter.   Patient Instructions  Medication Instructions:  Your physician recommends that you continue on your current medications as directed. Please refer to the Current Medication list given to you today.  *If you need a refill on your cardiac medications before your next appointment, please call your pharmacy*  Lab Work:  None ordered today.  Testing/Procedures: None ordered today.  Follow-Up: At [facility], you and your health needs are our priority.  As part of our continuing mission to provide you with exceptional heart care, we have created designated Provider Care Teams.  These Care Teams include your primary Cardiologist (physician) and Advanced Practice Providers (APPs -  Physician Assistants and Nurse Practitioners) who all work together to provide you with the care you need, when you need it.  Your next appointment:   1 year(s)  The format for your next appointment:   In Person  Provider:   Tonny Bollman, MD {   Signed, Tonny Bollman, MD  02/16/2023 2:38 PM    [facility]

## 2023-02-17 NOTE — Addendum Note (Signed)
Addended by: Lacy Duverney R on: 02/17/2023 04:00 PM   Modules accepted: Orders

## 2023-02-18 ENCOUNTER — Other Ambulatory Visit: Payer: Self-pay

## 2023-02-18 MED ORDER — EZETIMIBE-SIMVASTATIN 10-40 MG PO TABS: 1.0000 | ORAL_TABLET | Freq: Every day | ORAL | 3 refills | Status: DC

## 2023-03-10 DIAGNOSIS — H47333 Pseudopapilledema of optic disc, bilateral: Secondary | ICD-10-CM | POA: Diagnosis not present

## 2023-03-10 DIAGNOSIS — I700 Atherosclerosis of aorta: Secondary | ICD-10-CM | POA: Diagnosis not present

## 2023-03-10 DIAGNOSIS — Z0001 Encounter for general adult medical examination with abnormal findings: Secondary | ICD-10-CM | POA: Diagnosis not present

## 2023-03-10 DIAGNOSIS — N5231 Erectile dysfunction following radical prostatectomy: Secondary | ICD-10-CM | POA: Diagnosis not present

## 2023-03-10 DIAGNOSIS — E7800 Pure hypercholesterolemia, unspecified: Secondary | ICD-10-CM | POA: Diagnosis not present

## 2023-03-11 DIAGNOSIS — E7800 Pure hypercholesterolemia, unspecified: Secondary | ICD-10-CM | POA: Diagnosis not present

## 2023-03-11 DIAGNOSIS — Z79899 Other long term (current) drug therapy: Secondary | ICD-10-CM | POA: Diagnosis not present

## 2023-04-27 DIAGNOSIS — K080 Exfoliation of teeth due to systemic causes: Secondary | ICD-10-CM | POA: Diagnosis not present

## 2023-04-28 DIAGNOSIS — K080 Exfoliation of teeth due to systemic causes: Secondary | ICD-10-CM | POA: Diagnosis not present

## 2023-05-05 DIAGNOSIS — K080 Exfoliation of teeth due to systemic causes: Secondary | ICD-10-CM | POA: Diagnosis not present

## 2023-05-12 IMAGING — CT CT CHEST W/O CM
2 of 5 series · 15 of 36 positions shown, 18 images · non-contrast
Comparison: December 27, 2020

CLINICAL DATA: Lung nodules <6mm in left lung



[Series 4: chest 2.00 br40 s3 · coronal · 0.66mm/px · 3 of 143 slices shown]
[im 29/143  lung]
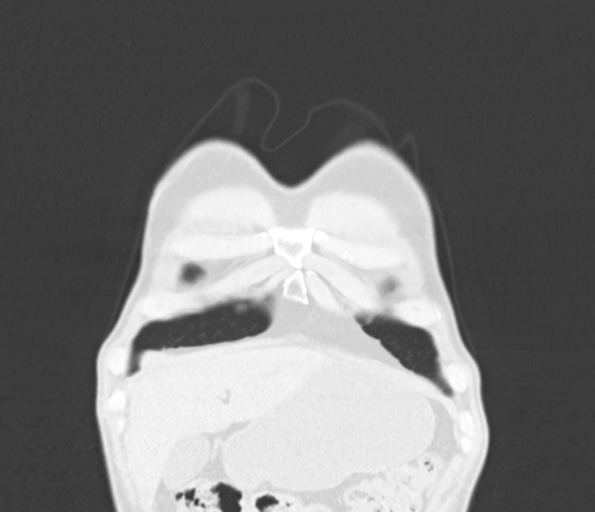
[im 57/143  lung]
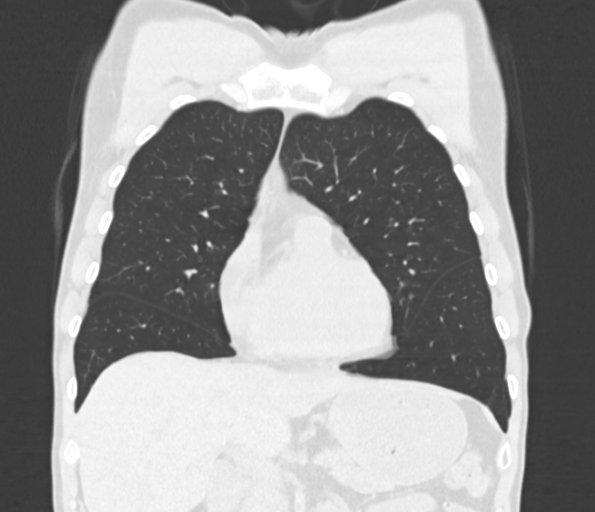
[im 86/143  lung]
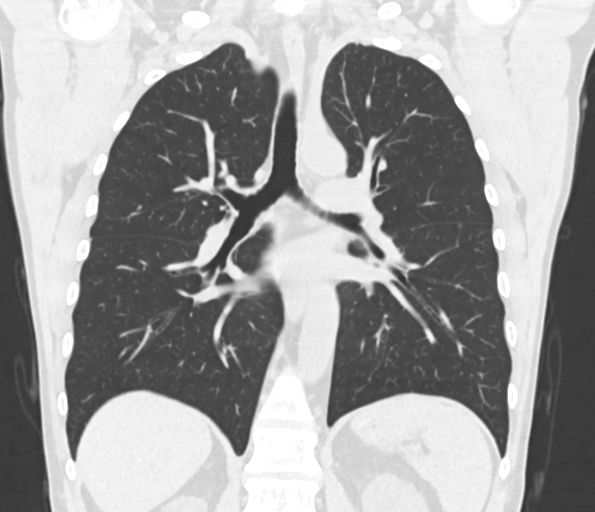

[Series 10: chest 1.00 br40 s3 super d · axial · 0.76mm/px · z∈[+1547,+1841]mm · 12 of 425 slices shown, 15 images]
[im 29/425  mediastinal]
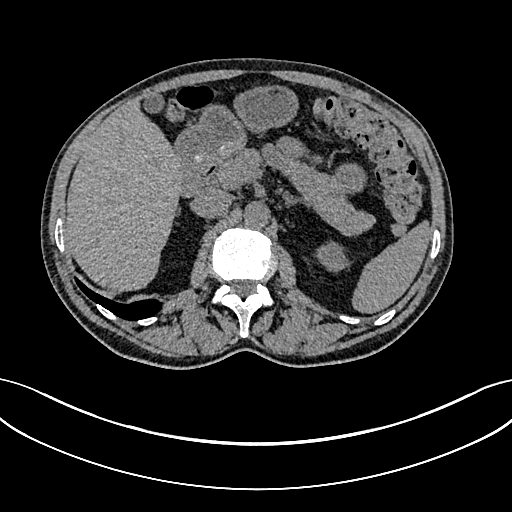
[im 29/425  lung]
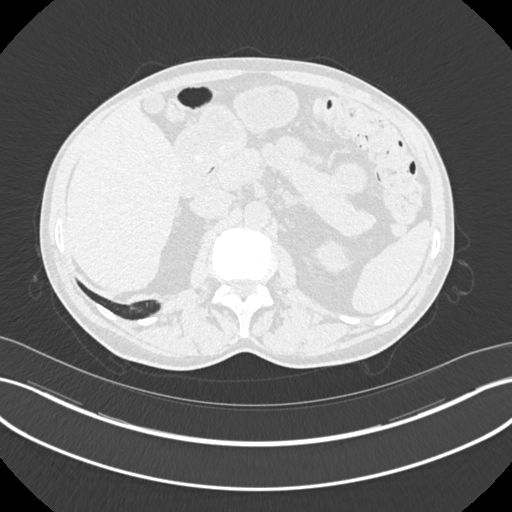
[im 57/425  lung]
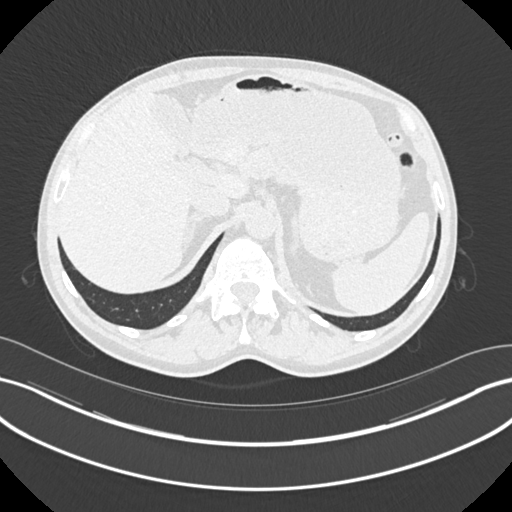
[im 85/425  lung]
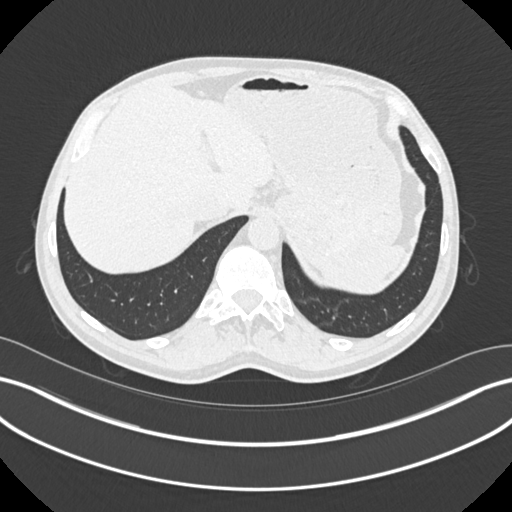
[im 142/425  lung]
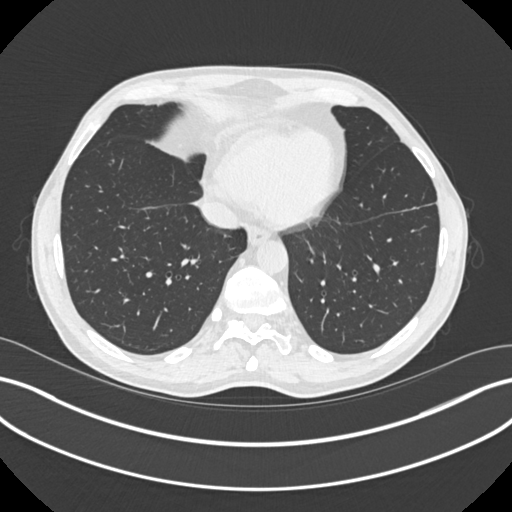
[im 170/425  mediastinal]
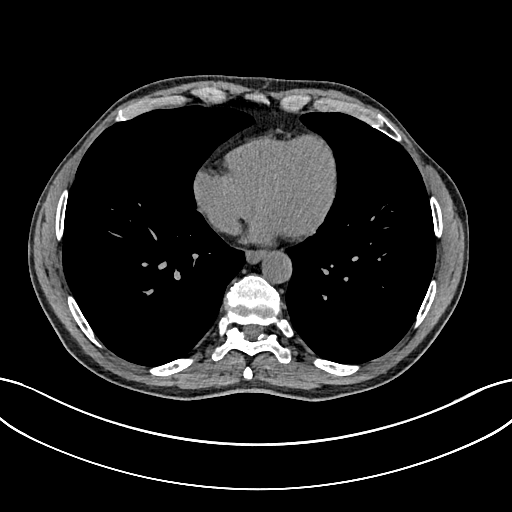
[im 170/425  lung]
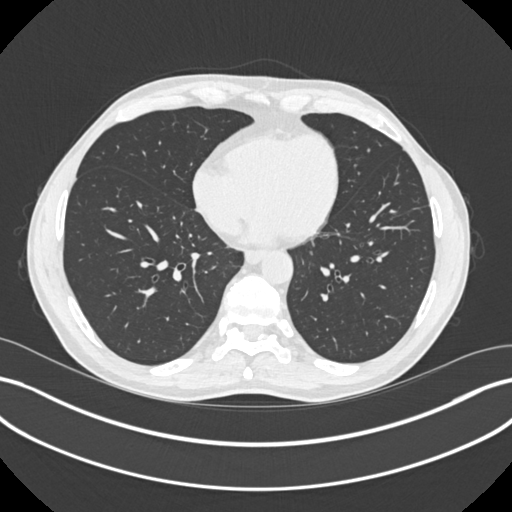
[im 198/425  lung]
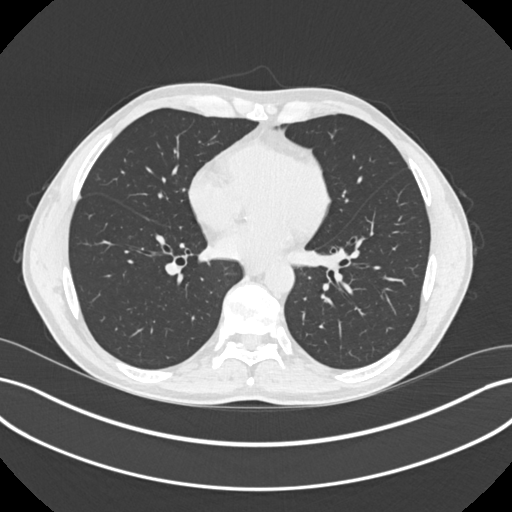
[im 227/425  lung]
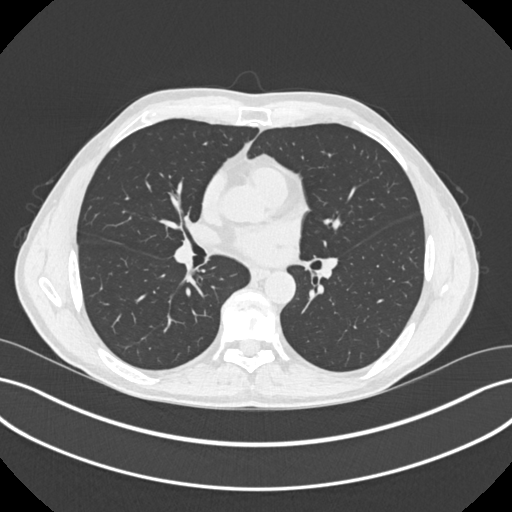
[im 255/425  lung]
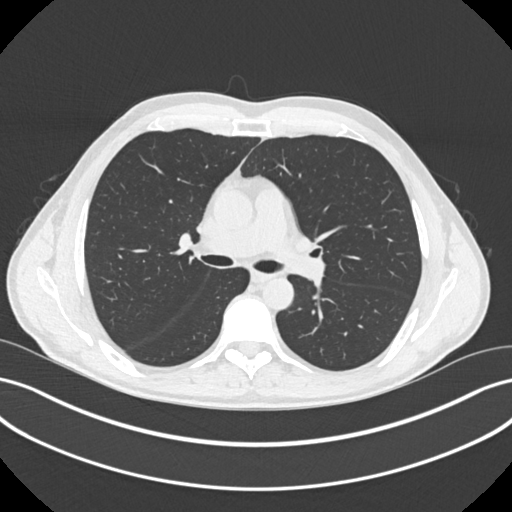
[im 283/425  mediastinal]
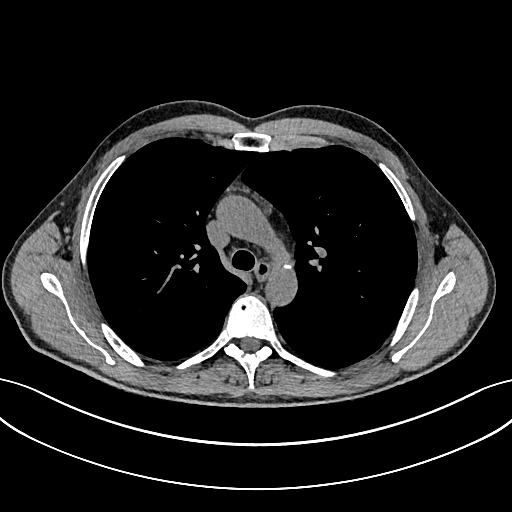
[im 283/425  lung]
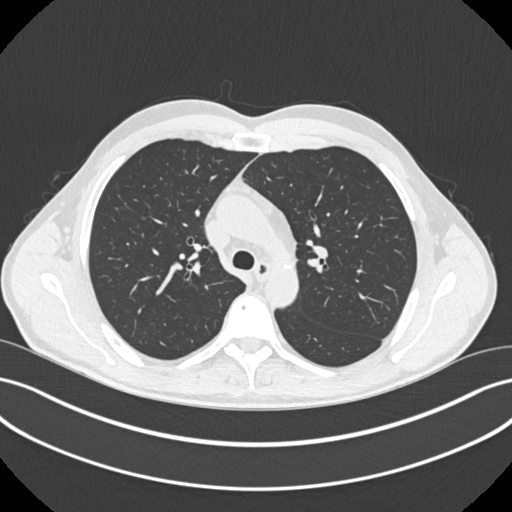
[im 340/425  lung]
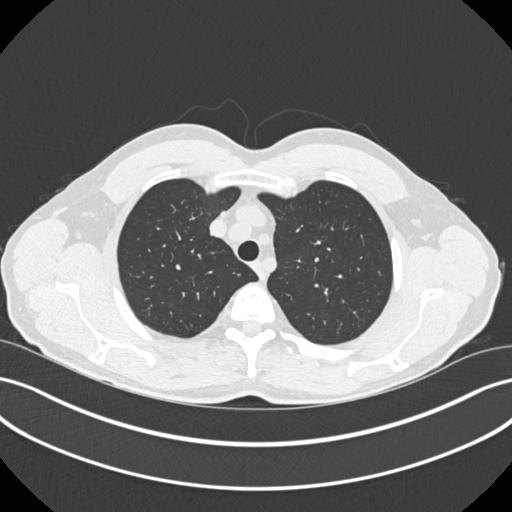
[im 368/425  lung]
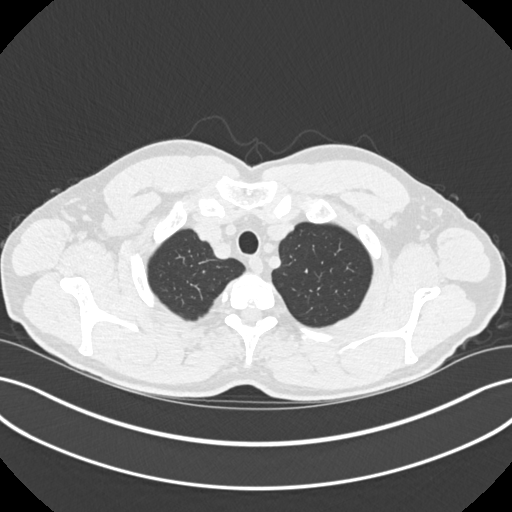
[im 396/425  lung]
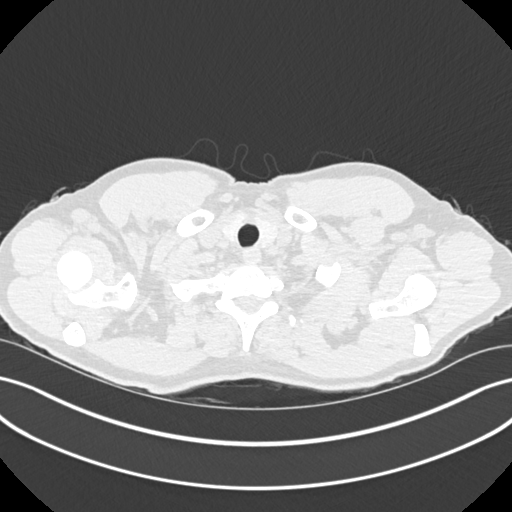

[15 of 36 positions shown; findings below may reference images not displayed]

FINDINGS: Cardiovascular: Heart is normal in size. No pericardial effusion.
Aortic valve calcifications. Aorta is normal in course and caliber.

Mediastinum/Nodes: No axillary or mediastinal adenopathy. Visualized
thyroid is unremarkable. Trace amount of anterior mediastinal soft
tissue with interdigitating fat likely reflecting thymic
hyperplasia.

Lungs/Pleura: No pleural effusion or pneumothorax.

Revisualization of scattered pulmonary nodules with representative
nodules as follows:

Nodule 1: LEFT lower lobe pulmonary nodule measures 3 by 2 mm,
unchanged (series 8, image 74).

Nodule 2: RIGHT apical pulmonary nodule measures 2 mm, previously
outside of the field of view (series 8, image 29).

Nodule 3: LEFT lower lobe pulmonary nodule measures 4 x 2 mm,
unchanged (series 8, image 75).

Upper Abdomen: Mildly nodular contour of the liver. Incompletely
visualized hypodense subcentimeter mass of the posterior LEFT
kidney, nonspecific. Mild atherosclerotic calcifications of the
abdominal aorta.

Musculoskeletal: No acute osseous abnormality. Bone island of T12
spinous process.
IMPRESSION: 1. Stable appearance of 2 LEFT lower lobe pulmonary nodules
measuring up to 3 mm. There is an additional 2 mm pulmonary nodule
of the RIGHT upper lobe which was outside of the field of view on
prior study. No follow-up needed if patient is low-risk (and has no
known or suspected primary neoplasm). Non-contrast chest CT can be
considered in 12 months if patient is high-risk. This recommendation
follows the consensus statement: Guidelines for Management of
Incidental Pulmonary Nodules Detected on CT Images: From the
2. Mildly nodular contour of the liver could reflect underlying
cirrhosis.

Aortic Atherosclerosis ([id number]-BFS.S).

## 2023-05-20 NOTE — Progress Notes (Unsigned)
[name] [address] [place] [address] Phone: [phone] Subjective:   INadine Counts, am serving as a scribe for Dr. Antoine Primas.  I'm seeing this patient by the request  of:  Koirala, Dibas, MD  CC: Right arm pain  [id number]  [name] is a 67 y.o. male coming in with complaint of R arm pain. Patient states ladder fell on arm. Has been getting better. Numbness in arm still remains. No other concerns.      Past Medical History:  Diagnosis Date   Colon polyps    Hypercholesteremia    Prostate cancer (HCC) 04/2016   Past Surgical History:  Procedure Laterality Date   PROSTATECTOMY  04/2016   UPPER GASTROINTESTINAL ENDOSCOPY  12/24/2022   Social History   Socioeconomic History   Marital status: Married    Spouse name: Not on file   Number of children: 0   Years of education: Not on file   Highest education level: Not on file  Occupational History   Occupation: RN  Tobacco Use   Smoking status: Never   Smokeless tobacco: Never  Vaping Use   Vaping status: Never Used  Substance and Sexual Activity   Alcohol use: No   Drug use: No   Sexual activity: Not on file  Other Topics Concern   Not on file  Social History Narrative   Not on file   Social Determinants of Health   Financial Resource Strain: Not on file  Food Insecurity: Not on file  Transportation Needs: Not on file  Physical Activity: Sufficiently Active (07/27/2018)   Received from [facility], [facility]   Exercise Vital Sign    Days of Exercise per Week: 5 days    Minutes of Exercise per Session: 120 min  Stress: Not on file  Social Connections: Not on file   Allergies  Allergen Reactions   Penicillins Rash   Family History  Problem Relation Age of Onset   Heart attack Mother      Current Outpatient Medications (Cardiovascular):    colestipol (COLESTID) 1 g tablet, Take 1 tablet (1 g total) by  mouth 2 (two) times daily.   ezetimibe-simvastatin (VYTORIN) 10-40 MG tablet, Take 1 tablet by mouth at bedtime.   metoprolol tartrate (LOPRESSOR) 25 MG tablet, Take 1 tablet by mouth up to twice daily only as needed  Current Outpatient Medications (Respiratory):    Azelastine HCl 137 MCG/SPRAY SOLN, Place 1 spray into both nostrils 2 (two) times daily.   fluticasone (FLONASE) 50 MCG/ACT nasal spray, Place into the nose as needed.   levocetirizine (XYZAL) 5 MG tablet, SMARTSIG:1 Tablet(s) By Mouth Every Evening   Current Outpatient Medications (Hematological):    Cyanocobalamin (B-12) 1000 MCG CAPS,   Current Outpatient Medications (Other):    hydrocortisone 2.5 % cream, SMARTSIG:Sparingly Topical Twice Daily PRN   pantoprazole (PROTONIX) 40 MG tablet, Take 1 tablet (40 mg total) by mouth daily.   Reviewed prior external information including notes and imaging from  primary care provider As well as notes that were available from care everywhere and other healthcare systems.  Past medical history, social, surgical and family history all reviewed in electronic medical record.  No pertanent information unless stated regarding to the chief complaint.   Review of Systems:  No headache, visual changes, nausea, vomiting, diarrhea, constipation, dizziness, abdominal pain, skin rash, fevers, chills, night sweats, weight loss, swollen lymph nodes, body aches, joint swelling, chest pain,  shortness of breath, mood changes. POSITIVE muscle aches  Objective  Blood pressure (!) 130/90, pulse 78, height 5\' 7"  (1.702 m), weight 147 lb (66.7 kg), SpO2 96%.   General: No apparent distress alert and oriented x3 mood and affect normal, dressed appropriately.  HEENT: Pupils equal, extraocular movements intact  Respiratory: Patient's speak in full sentences and does not appear short of breath  Cardiovascular: No lower extremity edema, non tender, no erythema  Right arm exam shows Contusion still noted  with some bruising noted on the dorsal and palmar side of the forearm.  Full range of motion with supination and pronation good grip strength.  Limited muscular skeletal ultrasound was performed and interpreted by Antoine Primas, M  Limited ultrasound shows patient does have what appears to be some hyperechoic changes of the likely superficial musculature.  Do not see any significant hematoma noted. Impression: Forearm contusion   Impression and Recommendations:     The above documentation has been reviewed and is accurate and complete Judi Saa, DO

## 2023-05-21 ENCOUNTER — Other Ambulatory Visit: Payer: Self-pay

## 2023-05-21 ENCOUNTER — Ambulatory Visit: Payer: Medicare Other | Admitting: Family Medicine

## 2023-05-21 ENCOUNTER — Ambulatory Visit (INDEPENDENT_AMBULATORY_CARE_PROVIDER_SITE_OTHER): Payer: Medicare Other

## 2023-05-21 ENCOUNTER — Encounter: Payer: Self-pay | Admitting: Family Medicine

## 2023-05-21 VITALS — BP 130/90 | HR 78 | Ht 67.0 in | Wt 147.0 lb

## 2023-05-21 DIAGNOSIS — S5011XA Contusion of right forearm, initial encounter: Secondary | ICD-10-CM | POA: Diagnosis not present

## 2023-05-21 DIAGNOSIS — M79631 Pain in right forearm: Secondary | ICD-10-CM | POA: Diagnosis not present

## 2023-05-21 DIAGNOSIS — S59911A Unspecified injury of right forearm, initial encounter: Secondary | ICD-10-CM

## 2023-05-21 DIAGNOSIS — S5010XA Contusion of unspecified forearm, initial encounter: Secondary | ICD-10-CM | POA: Insufficient documentation

## 2023-05-21 NOTE — Assessment & Plan Note (Addendum)
Patient did have a forearm contusion noted.  Does seem to be resolving.  Ultrasound does not show any necrosis.  No cortical defect but will get x-rays to further evaluate.  Discussed with patient about icing regimen and home exercises.  We discussed with patient to have worsening symptoms and when to seek medical attention.  Follow-up with me again in 4 weeks if does not completely resolve

## 2023-05-21 NOTE — Patient Instructions (Signed)
Good to see you! Arnica lotion and Ice Xrays today Call us if not better in a week

## 2023-05-25 ENCOUNTER — Ambulatory Visit: Payer: Medicare Other | Admitting: Family Medicine

## 2023-05-25 NOTE — Progress Notes (Incomplete)
[name]    [id number]    Jul 30, 1956  Primary Care Physician:Koirala, Dibas, MD  Referring Physician: Darrow Bussing, MD [address] [place],  [place] [address]   Chief complaint:  Diarrhea No chief complaint on file.  HPI: 67 year old male with history of adenomatous colon polyps.  Last seen on 12-11-22 by PA Lemmon for Melena.   Today,    GI Hx: EGD 12-24-22 - Z-line regular, 38 cm from the incisors.  - Normal esophagus.  - Gastritis. Biopsied.  - Normal examined duodenum Surgical [P], gastric antrum and gastric body - GASTRIC ANTRAL AND OXYNTIC MUCOSA WITH NO SPECIFIC PATHOLOGIC CHANGES - NEGATIVE FOR H. PYLORI ON H&E STAIN - NEGATIVE FOR INTESTINAL METAPLASIA OR MALIGNANCY  MRI Abdomen 07-28-22 Normal morphology of the liver. No suspicious hepatic lesion.   Liver stiffness by MR Elastography:  2.12 kPa   US Abdomen RUQ 12-18-21 Nodular hepatic margins are noted with heterogeneous echotexture of hepatic parenchyma suggesting hepatic cirrhosis. No definite focal sonographic hepatic abnormality is noted.  Colonoscopy 08-07-19 - Normal mucosa in the entire examined colon. Biopsied.  - Mild diverticulosis in the sigmoid colon and in the descending colon.  - Non-bleeding internal hemorrhoids.  - The examination was otherwise normal. 1. Surgical [P], right colon - COLONIC MUCOSA WITH NO SPECIFIC HISTOPATHOLOGIC CHANGES - NEGATIVE FOR ACUTE INFLAMMATION, INCREASED INTRAEPITHELIAL LYMPHOCYTES OR THICKENED SUBEPITHELIAL COLLAGEN TABLE 2. Surgical [P], left colon - COLONIC MUCOSA WITH NO SPECIFIC HISTOPATHOLOGIC CHANGES - NEGATIVE FOR ACUTE INFLAMMATION, INCREASED INTRAEPITHELIAL LYMPHOCYTES OR THICKENED SUBEPITHELIAL COLLAGEN TABLE   Has a history of H. pylori and being treated with "multiple medicines"  and also question of an ulcer from NSAID use back about 5 to 10 years ago.    His first colonoscopy what was at age 17, had 2 or  3 polyps removed that were precancerous, report is not available for review.  He has been having colonoscopy every 4 to 5 years since then   Personal history of colon polyps, colonoscopy November 2010 Colonoscopy 04/20/2015: Normal   EGD 07/21/2013 Gastric biopsies positive for H. pylori status post treatment and confirmed eradication    Current Outpatient Medications:    Azelastine HCl 137 MCG/SPRAY SOLN, Place 1 spray into both nostrils 2 (two) times daily., Disp: , Rfl:    colestipol (COLESTID) 1 g tablet, Take 1 tablet (1 g total) by mouth 2 (two) times daily., Disp: 180 tablet, Rfl: 1   Cyanocobalamin (B-12) 1000 MCG CAPS, , Disp: , Rfl:    ezetimibe-simvastatin (VYTORIN) 10-40 MG tablet, Take 1 tablet by mouth at bedtime., Disp: 90 tablet, Rfl: 3   fluticasone (FLONASE) 50 MCG/ACT nasal spray, Place into the nose as needed., Disp: , Rfl:    hydrocortisone 2.5 % cream, SMARTSIG:Sparingly Topical Twice Daily PRN, Disp: , Rfl:    levocetirizine (XYZAL) 5 MG tablet, SMARTSIG:1 Tablet(s) By Mouth Every Evening, Disp: , Rfl:    metoprolol tartrate (LOPRESSOR) 25 MG tablet, Take 1 tablet by mouth up to twice daily only as needed, Disp: 180 tablet, Rfl: 0   pantoprazole (PROTONIX) 40 MG tablet, Take 1 tablet (40 mg total) by mouth daily., Disp: 90 tablet, Rfl: 0    Allergies as of 05/26/2023 - Review Complete 05/21/2023  Allergen Reaction Noted   Penicillins Rash 07/04/2012    Past Medical History:  Diagnosis Date   Colon polyps    Hypercholesteremia    Prostate cancer (HCC) 04/2016  Past Surgical History:  Procedure Laterality Date   PROSTATECTOMY  04/2016   UPPER GASTROINTESTINAL ENDOSCOPY  12/24/2022    Family History  Problem Relation Age of Onset   Heart attack Mother     Social History   Socioeconomic History   Marital status: Married    Spouse name: Not on file   Number of children: 0   Years of education: Not on file   Highest education level: Not on file   Occupational History   Occupation: RN  Tobacco Use   Smoking status: Never   Smokeless tobacco: Never  Vaping Use   Vaping status: Never Used  Substance and Sexual Activity   Alcohol use: No   Drug use: No   Sexual activity: Not on file  Other Topics Concern   Not on file  Social History Narrative   Not on file   Social Determinants of Health   Financial Resource Strain: Not on file  Food Insecurity: Not on file  Transportation Needs: Not on file  Physical Activity: Sufficiently Active (07/27/2018)   Received [facility], [facility]   Exercise Vital Sign    Days of Exercise per Week: 5 days    Minutes of Exercise per Session: 120 min  Stress: Not on file  Social Connections: Not on file  Intimate Partner Violence: Not on file     Review of systems: Review of Systems    Physical Exam: There were no vitals filed for this visit.  There is no height or weight on file to calculate BMI. General: well-appearing ***  Eyes: sclera anicteric, no redness ENT: oral mucosa moist without lesions, no cervical or supraclavicular lymphadenopathy CV: RRR, no JVD, no peripheral edema Resp: clear to auscultation bilaterally, normal RR and effort noted GI: soft, no tenderness, with active bowel sounds. No guarding or palpable organomegaly noted. Skin; warm and dry, no rash or jaundice noted Neuro: awake, alert and oriented x 3. Normal gross motor function and fluent speech    Data Reviewed:  Reviewed labs, radiology imaging, old records and pertinent past GI work up   Assessment and Plan/Recommendations:  67 year old male with history of multiple adenomatous colon polyps starting at age 36 with complaints of irregular bowel habits and diarrhea Will schedule for colonoscopy for further evaluation, exclude microscopic colitis The risks and benefits as well as alternatives of endoscopic procedure(s) have been discussed and reviewed. All  questions answered. The patient agrees to proceed.  Discussed lactose-free diet, will do a trial of lactose-free diet for 1 to 2 weeks  Benefiber 1 tablespoon 3 times daily with meals  If continues to have persistent diarrhea in the bowel work-up negative, will consider trial of Colestid 1 g twice daily for possible bile salt induced diarrhea  Return in 6 months or sooner if needed   K. Scherry Ran , MD    CC: Darrow Bussing, MD   Ladona Mow Hewitt Shorts as a scribe for Marsa Aris, MD.,have documented all relevant documentation on the behalf of Marsa Aris, MD,as directed by  Marsa Aris, MD while in the presence of Marsa Aris, MD.   I, Marsa Aris, MD, have reviewed all documentation for this visit. The documentation on 05/25/23 for the exam, diagnosis, procedures, and orders are all accurate and complete.

## 2023-05-26 ENCOUNTER — Ambulatory Visit: Payer: Medicare Other | Admitting: Gastroenterology

## 2023-06-04 ENCOUNTER — Telehealth: Payer: Self-pay | Admitting: Family Medicine

## 2023-06-04 NOTE — Telephone Encounter (Signed)
Pt is requesting to switch PCP's from Dr. Docia Chuck to Dr. Okey Dupre. Please let me know if this is acceptable.

## 2023-06-04 NOTE — Telephone Encounter (Signed)
That provider is not within our system so just schedule np visit

## 2023-06-15 NOTE — Telephone Encounter (Signed)
Have left multiple VM for pt and send a mychart message to schedule New Patient appointment. Pt has not responded so far.

## 2023-06-16 IMAGING — US US LIVER ELASTOGRAPHY
1 series · 12 of 25 positions shown · non-contrast
Comparison: 11/29/2021 CT, ultrasound abdomen 12/18/2021

CLINICAL DATA: Abnormal CT liver possible cirrhosis

EXAM:
US LIVER ELASTOGRAPHY
TECHNIQUE: Sonography of the liver was performed. In addition, ultrasound
elastography evaluation of the liver was performed. A region of
interest was placed within the right lobe of the liver. Following
application of a compressive sonographic pulse, tissue
compressibility was assessed. Multiple assessments were performed at
the selected site. Median tissue compressibility was determined.
Previously, hepatic stiffness was assessed by shear wave velocity.
Based on recently published Society of Radiologists in Ultrasound
consensus article, reporting is now recommended to be performed in
the SI units of pressure (kiloPascals) representing hepatic
stiffness/elasticity. The obtained result is compared to the
published reference standards. (cACLD = compensated Advanced Chronic
Liver Disease)

[Series 1: us liver elastography · 12 of 30 slices shown]
[im 2/30]
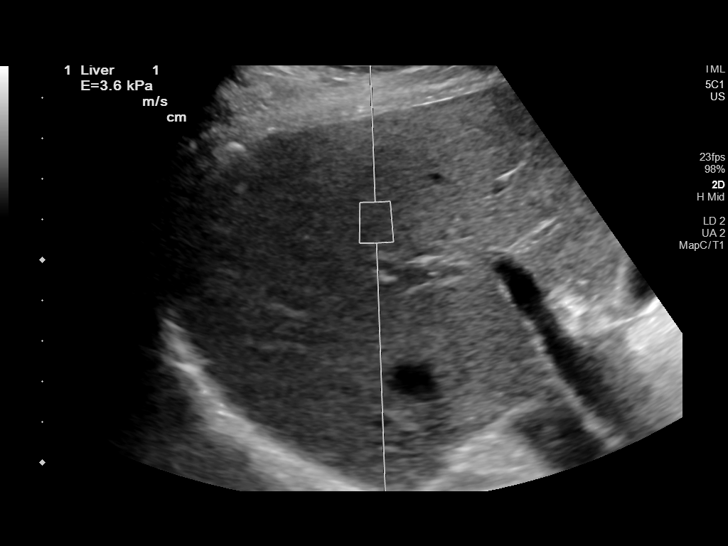
[im 4/30]
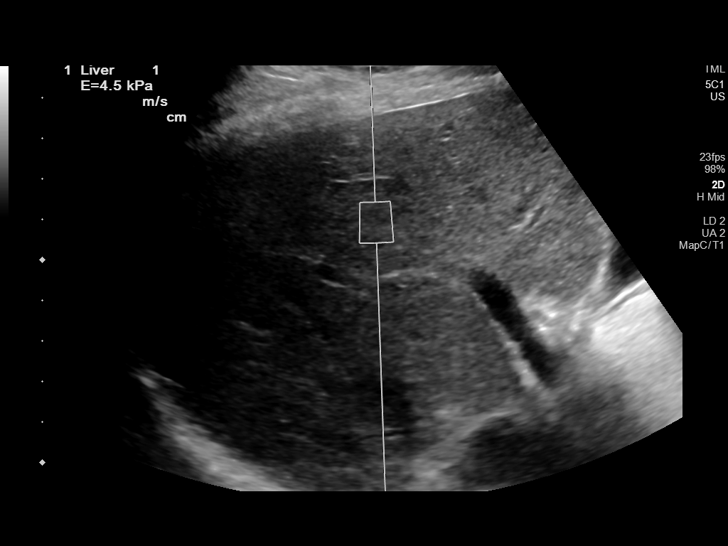
[im 7/30]
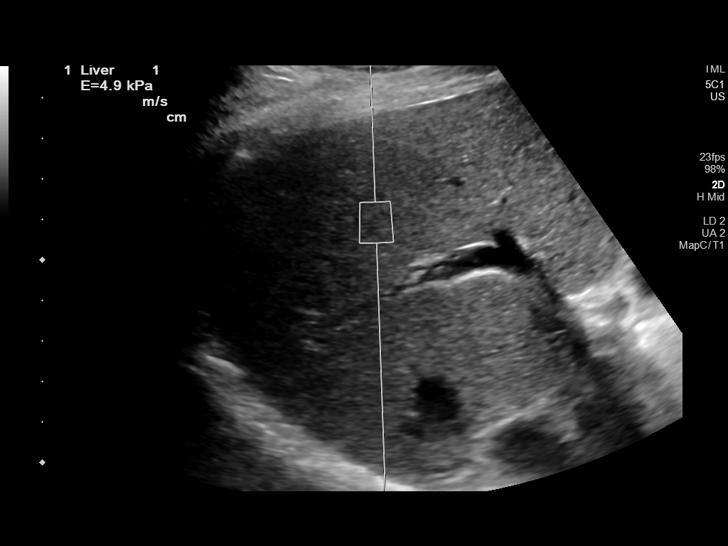
[im 9/30]
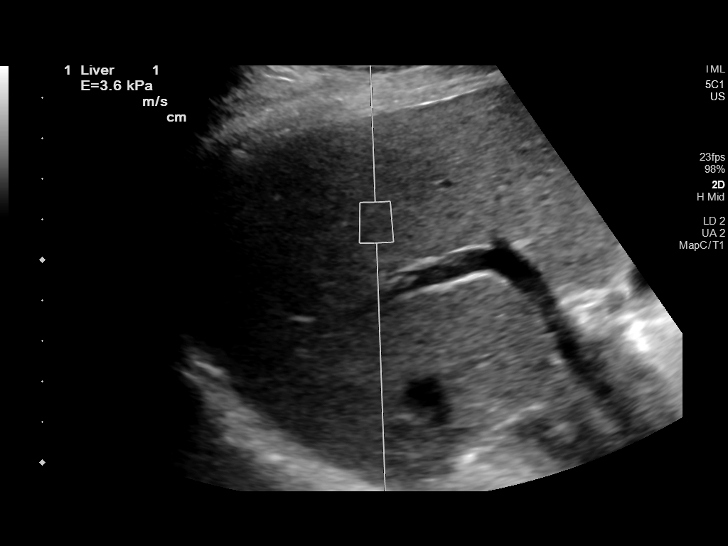
[im 11/30]
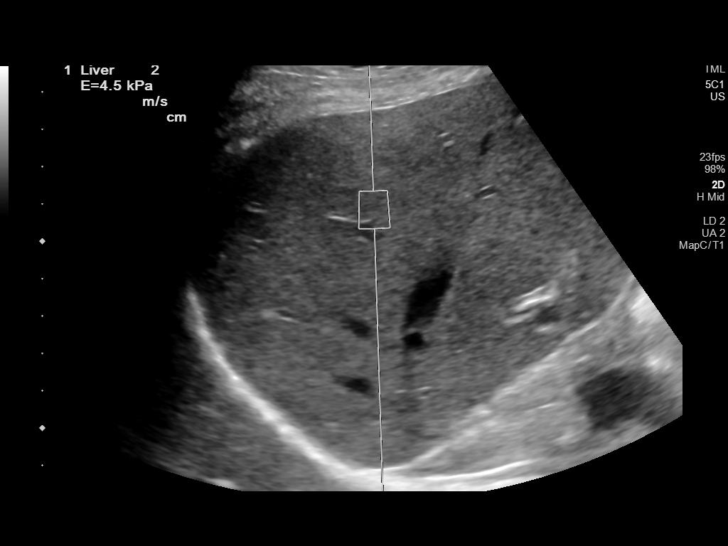
[im 14/30]
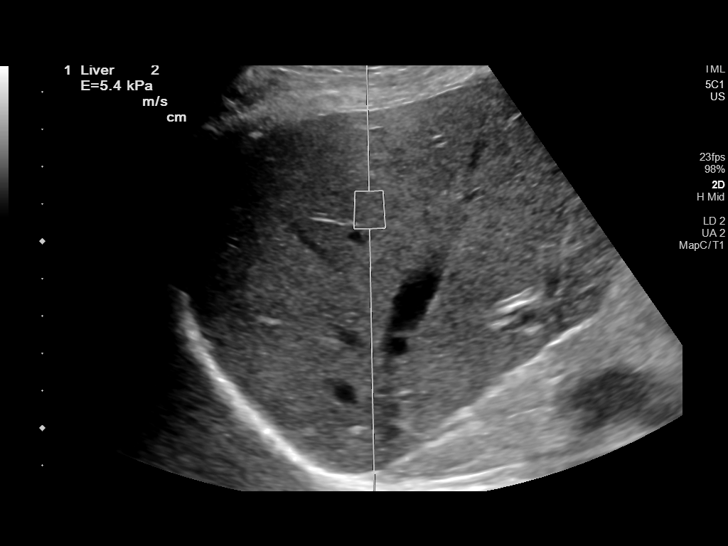
[im 16/30]
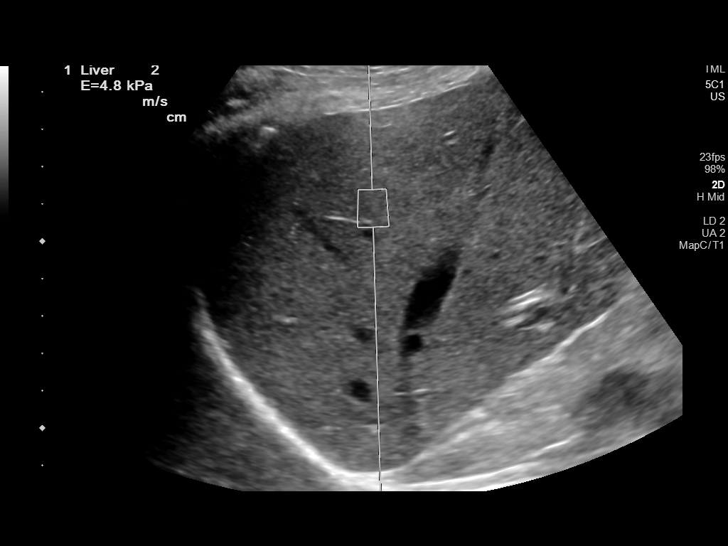
[im 19/30]
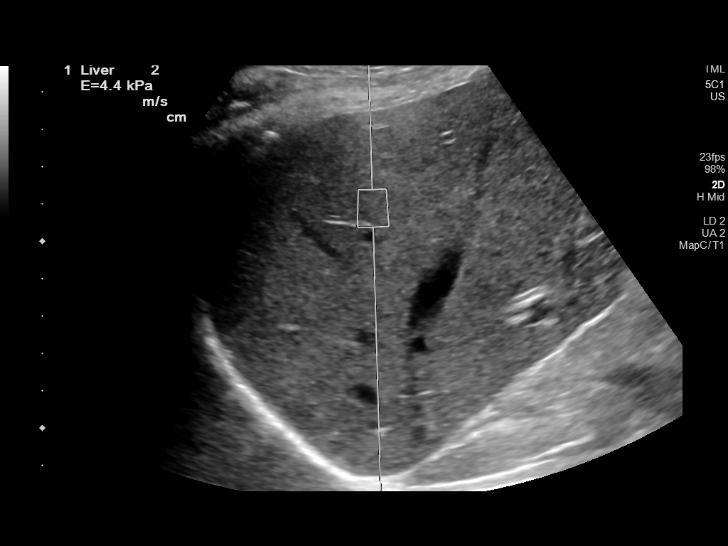
[im 21/30]
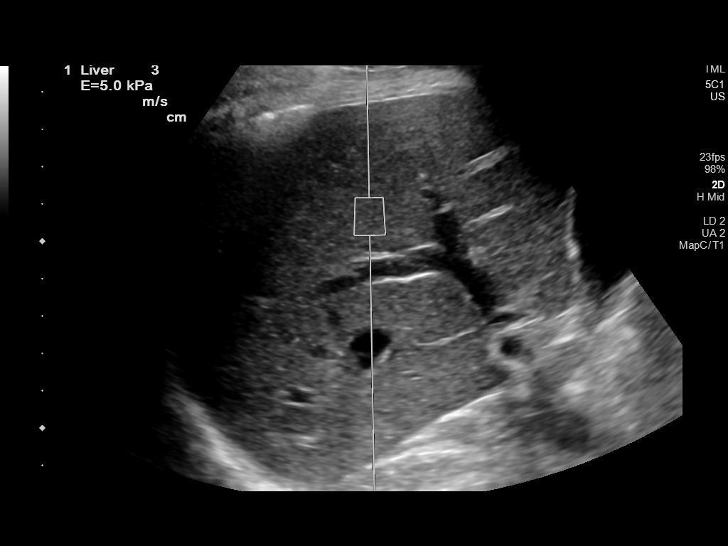
[im 23/30]
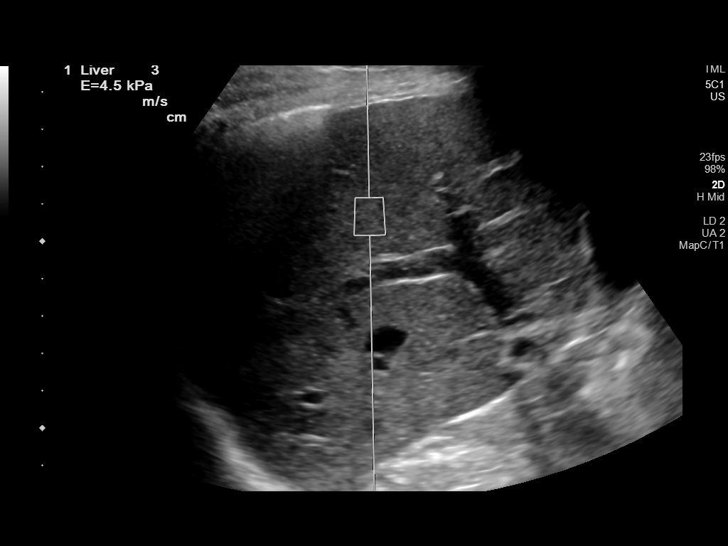
[im 26/30]
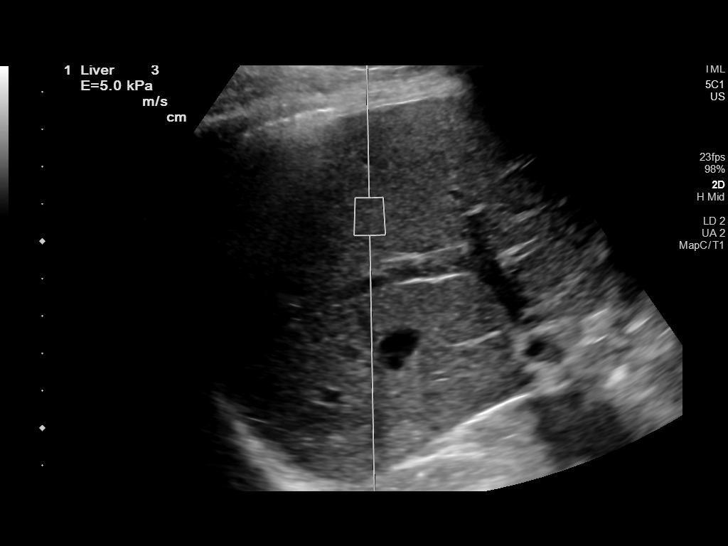
[im 28/30]
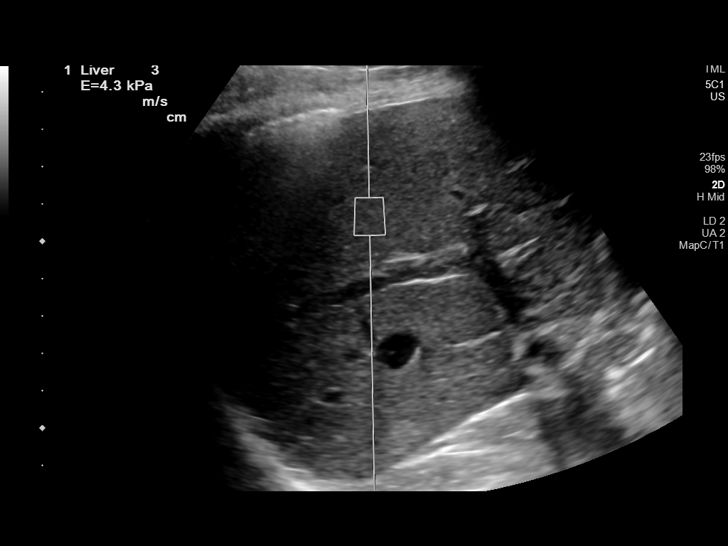

[12 of 25 positions shown; findings below may reference images not displayed]

FINDINGS: Liver: Normal parenchymal echogenicity. No gross mass or nodularity.
Portal vein was not assessed by color Doppler imaging on current
exam, was patent with normal direction of flow on recent ultrasound.

ULTRASOUND HEPATIC ELASTOGRAPHY

Device: Siemens Helix VTQ

Patient position: Not recorded

Transducer: [id number]

Number of measurements: 10

Hepatic segment:  8

Median kPa:

IQR:

IQR/Median kPa ratio:

Data quality:  Good

Diagnostic category:  < or = 5 kPa: high probability of being normal

The use of hepatic elastography is applicable to patients with viral
hepatitis and non-alcoholic fatty liver disease. At this time, there
is insufficient data for the referenced cut-off values and use in
other causes of liver disease, including alcoholic liver disease.
Patients, however, may be assessed by elastography and serve as
their own reference standard/baseline.

In patients with non-alcoholic liver disease, the values suggesting
compensated advanced chronic liver disease (cACLD) may be lower, and
patients may need additional testing with elasticity results of [DATE]
kPa.

Please note that abnormal hepatic elasticity and shear wave
velocities may also be identified in clinical settings other than
with hepatic fibrosis, such as: acute hepatitis, elevated right
heart and central venous pressures including use of beta blockers,
Mor disease (Mock), infiltrative processes such as
mastocytosis/amyloidosis/infiltrative tumor/lymphoma, extrahepatic
cholestasis, with hyperemia in the post-prandial state, and with
liver transplantation. Correlation with patient history, laboratory
data, and clinical condition recommended.

Diagnostic Categories:

< or =5 kPa: high probability of being normal

< or =9 kPa: in the absence of other known clinical signs, rules [DATE] kPa and ?13 kPa: suggestive of cACLD, but needs further testing

>13 kPa: highly suggestive of cACLD

> or =17 kPa: highly suggestive of cACLD with an increased
probability of clinically significant portal hypertension
IMPRESSION: ULTRASOUND LIVER:

Normal hepatic parenchymal echogenicity without nodularity.

ULTRASOUND HEPATIC ELASTOGRAPHY:

Median kPa:

Diagnostic category:  < or = 5 kPa: high probability of being normal

## 2023-07-08 DIAGNOSIS — K080 Exfoliation of teeth due to systemic causes: Secondary | ICD-10-CM | POA: Diagnosis not present

## 2023-08-17 ENCOUNTER — Other Ambulatory Visit: Payer: Self-pay | Admitting: *Deleted

## 2023-08-17 MED ORDER — COLESTIPOL HCL 1 G PO TABS: 1.0000 g | ORAL_TABLET | Freq: Two times a day (BID) | ORAL | 1 refills | Status: DC

## 2023-10-13 ENCOUNTER — Telehealth: Payer: Self-pay | Admitting: Gastroenterology

## 2023-10-13 NOTE — Telephone Encounter (Signed)
 Sure, if it is fine with Dr. Myrtie Neither.  His recommendation for recall was based on current GI society guidelines, is due for recall in November 2025

## 2023-10-13 NOTE — Telephone Encounter (Signed)
 Hi Dr. Shila,  Patient called requesting a transfer of care over to Dr. Legrand if possible. He stated his brother is currently a patient of Dr. Legrand and they are highly pleased with him. The patient is also concerned about the recall recommendations would like to have a colonoscopy done sooner.  Please advise on scheduling.  Thanks

## 2023-10-14 NOTE — Telephone Encounter (Signed)
 Good morning Dr. Dominic Friendly,  Patient requested a transfer of care specifically over to you stated his brother is currently your patient and they're very pleased with you. Please advise on scheduling.  Thank you

## 2023-10-14 NOTE — Telephone Encounter (Signed)
 I would be happy to see him.  I would like to say that I agree with Dr. Allean Aran recommended surveillance colonoscopy timeframe.  - H. Danis

## 2023-12-02 ENCOUNTER — Ambulatory Visit: Payer: Medicare Other | Admitting: Internal Medicine

## 2023-12-02 ENCOUNTER — Encounter: Payer: Self-pay | Admitting: Internal Medicine

## 2023-12-02 VITALS — BP 120/80 | HR 72 | Temp 97.6°F | Ht 67.0 in | Wt 150.0 lb

## 2023-12-02 DIAGNOSIS — R002 Palpitations: Secondary | ICD-10-CM | POA: Diagnosis not present

## 2023-12-02 DIAGNOSIS — J343 Hypertrophy of nasal turbinates: Secondary | ICD-10-CM

## 2023-12-02 DIAGNOSIS — Z8546 Personal history of malignant neoplasm of prostate: Secondary | ICD-10-CM | POA: Diagnosis not present

## 2023-12-02 DIAGNOSIS — Z0000 Encounter for general adult medical examination without abnormal findings: Secondary | ICD-10-CM | POA: Diagnosis not present

## 2023-12-02 DIAGNOSIS — E782 Mixed hyperlipidemia: Secondary | ICD-10-CM | POA: Diagnosis not present

## 2023-12-02 DIAGNOSIS — R918 Other nonspecific abnormal finding of lung field: Secondary | ICD-10-CM

## 2023-12-02 LAB — COMPREHENSIVE METABOLIC PANEL
ALT: 16 U/L (ref 0–53)
AST: 15 U/L (ref 0–37)
Albumin: 4.4 g/dL (ref 3.5–5.2)
Alkaline Phosphatase: 40 U/L (ref 39–117)
BUN: 13 mg/dL (ref 6–23)
CO2: 28 meq/L (ref 19–32)
Calcium: 9.6 mg/dL (ref 8.4–10.5)
Chloride: 105 meq/L (ref 96–112)
Creatinine, Ser: 0.98 mg/dL (ref 0.40–1.50)
GFR: 79.03 mL/min (ref >60.00)
Glucose, Bld: 96 mg/dL (ref 70–99)
Potassium: 5.8 meq/L — ABNORMAL HIGH (ref 3.5–5.1)
Sodium: 139 meq/L (ref 135–145)
Total Bilirubin: 1 mg/dL (ref 0.2–1.2)
Total Protein: 6.7 g/dL (ref 6.0–8.3)

## 2023-12-02 LAB — CBC
HCT: 42.5 % (ref 39.0–52.0)
Hemoglobin: 14.3 g/dL (ref 13.0–17.0)
MCHC: 33.7 g/dL (ref 30.0–36.0)
MCV: 91.4 fl (ref 78.0–100.0)
Platelets: 267 10*3/uL (ref 150.0–400.0)
RBC: 4.65 Mil/uL (ref 4.22–5.81)
RDW: 13.6 % (ref 11.5–15.5)
WBC: 6.1 10*3/uL (ref 4.0–10.5)

## 2023-12-02 LAB — LIPID PANEL
Cholesterol: 140 mg/dL (ref 0–200)
HDL: 56.9 mg/dL (ref >39.00)
LDL Cholesterol: 67 mg/dL (ref 0–99)
NonHDL: 83.12
Total CHOL/HDL Ratio: 2
Triglycerides: 82 mg/dL (ref 0.0–149.0)
VLDL: 16.4 mg/dL (ref 0.0–40.0)

## 2023-12-02 LAB — PSA: PSA: 0 ng/mL — ABNORMAL LOW (ref 0.10–4.00)

## 2023-12-02 NOTE — Progress Notes (Unsigned)
   Subjective:   Patient ID: [name], male    DOB: 05-Feb-1956, 68 y.o.   MRN: [id number]  HPI The patient is a new 68 YO man coming in for ongoing care and follow up of chronic conditions.   Also desires physical.  PMH, FMH, social history reviewed and updated  Review of Systems  Constitutional: Negative.   HENT: Negative.    Eyes: Negative.   Respiratory:  Negative for cough, chest tightness and shortness of breath.   Cardiovascular:  Negative for chest pain, palpitations and leg swelling.  Gastrointestinal:  Negative for abdominal distention, abdominal pain, constipation, diarrhea, nausea and vomiting.  Musculoskeletal: Negative.   Skin: Negative.   Neurological: Negative.   Psychiatric/Behavioral: Negative.      Objective:  Physical Exam Constitutional:      Appearance: He is well-developed.  HENT:     Head: Normocephalic and atraumatic.  Cardiovascular:     Rate and Rhythm: Normal rate and regular rhythm.  Pulmonary:     Effort: Pulmonary effort is normal. No respiratory distress.     Breath sounds: Normal breath sounds. No wheezing or rales.  Abdominal:     General: Bowel sounds are normal. There is no distension.     Palpations: Abdomen is soft.     Tenderness: There is no abdominal tenderness. There is no rebound.  Musculoskeletal:     Cervical back: Normal range of motion.  Skin:    General: Skin is warm and dry.  Neurological:     Mental Status: He is alert and oriented to person, place, and time.     Coordination: Coordination normal.     Vitals:   12/02/23 0755  BP: 120/80  Pulse: 72  Temp: 97.6 F (36.4 C)  TempSrc: Oral  SpO2: 94%  Weight: 150 lb (68 kg)  Height: 5\' 7"  (1.702 m)    Assessment & Plan:

## 2023-12-02 NOTE — Patient Instructions (Signed)
 We will check the labs today.

## 2023-12-03 ENCOUNTER — Other Ambulatory Visit (INDEPENDENT_AMBULATORY_CARE_PROVIDER_SITE_OTHER)

## 2023-12-03 ENCOUNTER — Encounter: Payer: Self-pay | Admitting: Internal Medicine

## 2023-12-03 ENCOUNTER — Other Ambulatory Visit: Payer: Self-pay | Admitting: Internal Medicine

## 2023-12-03 DIAGNOSIS — Z0000 Encounter for general adult medical examination without abnormal findings: Secondary | ICD-10-CM | POA: Insufficient documentation

## 2023-12-03 DIAGNOSIS — R002 Palpitations: Secondary | ICD-10-CM | POA: Insufficient documentation

## 2023-12-03 DIAGNOSIS — E875 Hyperkalemia: Secondary | ICD-10-CM

## 2023-12-03 LAB — BASIC METABOLIC PANEL
BUN: 10 mg/dL (ref 6–23)
CO2: 29 meq/L (ref 19–32)
Calcium: 9.5 mg/dL (ref 8.4–10.5)
Chloride: 104 meq/L (ref 96–112)
Creatinine, Ser: 0.96 mg/dL (ref 0.40–1.50)
GFR: 81.01 mL/min (ref >60.00)
Glucose, Bld: 79 mg/dL (ref 70–99)
Potassium: 5.3 meq/L — ABNORMAL HIGH (ref 3.5–5.1)
Sodium: 139 meq/L (ref 135–145)

## 2023-12-03 NOTE — Assessment & Plan Note (Signed)
 Incidentally found and had had appropriate CT follow up. No further imaging is indicated but if change in symptoms (I.e. new chronic cough or SOB) can repeat.

## 2023-12-03 NOTE — Assessment & Plan Note (Signed)
 S/p radical prostatectomy 2017 and in clinical monitoring. Checking PSA as due for yearly follow up. No change in symptoms.

## 2023-12-03 NOTE — Assessment & Plan Note (Signed)
 Seeing ENT and uses flonase and xyzal with good control.

## 2023-12-03 NOTE — Assessment & Plan Note (Signed)
 Flu shot up to date. Pneumonia getting records he believes complete. Shingrix he believes complete getting records. Tetanus up to date. Colonoscopy due later this year. Counseled about sun safety and mole surveillance. Counseled about the dangers of distracted driving. Given 10 year screening recommendations.

## 2023-12-03 NOTE — Assessment & Plan Note (Signed)
 Checking CBC, CMP, lipid panel and adjust vytorin and colestipol as needed.

## 2023-12-03 NOTE — Assessment & Plan Note (Signed)
 Has metoprolol 25 mg BID prn from cardiology and has not used in several years. Was prominent when going through some stressors. Monitor and okay with prn use.

## 2023-12-18 DIAGNOSIS — H2513 Age-related nuclear cataract, bilateral: Secondary | ICD-10-CM | POA: Diagnosis not present

## 2023-12-18 DIAGNOSIS — H524 Presbyopia: Secondary | ICD-10-CM | POA: Diagnosis not present

## 2023-12-18 DIAGNOSIS — H25013 Cortical age-related cataract, bilateral: Secondary | ICD-10-CM | POA: Diagnosis not present

## 2023-12-29 ENCOUNTER — Encounter: Payer: Self-pay | Admitting: Gastroenterology

## 2023-12-29 ENCOUNTER — Ambulatory Visit: Payer: Medicare Other | Admitting: Gastroenterology

## 2023-12-29 VITALS — BP 124/70 | HR 72 | Ht 67.0 in | Wt 149.0 lb

## 2023-12-29 DIAGNOSIS — Z09 Encounter for follow-up examination after completed treatment for conditions other than malignant neoplasm: Secondary | ICD-10-CM

## 2023-12-29 DIAGNOSIS — Z860101 Personal history of adenomatous and serrated colon polyps: Secondary | ICD-10-CM

## 2023-12-29 DIAGNOSIS — Z83719 Family history of colon polyps, unspecified: Secondary | ICD-10-CM

## 2023-12-29 DIAGNOSIS — Z860100 Personal history of colon polyps, unspecified: Secondary | ICD-10-CM

## 2023-12-29 NOTE — Progress Notes (Signed)
 [facility] Consult Note:  History: Karl [name] 12/29/2023  Referring provider: Myrlene Broker, MD  Reason for consult/chief complaint: Colonoscopy (Pt states he is doing well today, pt states it is time for another colonoscopy)   Subjective  Prior history:  Expand All Collapse All  From APP clinic visit 12/11/22:   HPI:    Karl [name] is a 68 year old male with past medical history as listed below including prostate cancer, known to Dr. Lavon Paganini, who was referred to me by Darrow Bussing, MD for a complaint of iron deficiency anemia.      07/13/2019 patient seen in clinic for diarrhea.  At that time discussed his last colonoscopy 04/20/2015 was normal.  He was scheduled for repeat colonoscopy for further evaluation to exclude microscopic colitis.  Also discussed lactose-free diet and a fiber supplement.    08/17/2019 colonoscopy with mild diverticulosis in the sigmoid and descending colon and nonbleeding internal hemorrhoids.  Repeat recommended in 5 years given personal history of colon polyps.  Biopsies were negative.  He was started on Colestipol and doing well.  Refilled last 11/30/2019.    12/20/2020 CBC normal.    11/30/2021 CT of the chest without contrast done to examine lung nodule showed stable appearance of 2 left lower lobe pulmonary nodules measuring up to 3 mm as well as mildly nodular contour liver which could reflect underlying cirrhosis.    12/11/2021 patient seen in clinic by me after having a CT which had a question of cirrhosis.  At time continued on Colestipol 1 g twice a day for diarrhea.  At that time ordered right upper quadrant ultrasound and labs and refilled Colestipol.    12/16/2021 right upper quadrant ultrasound with nodular hepatic margins suggesting cirrhosis.    01/03/2022 ultrasound elastography of the liver with a median K PA 04.8.  Indicating that patient had a good probability of having a normal liver.    06/16/2022 patient went to [facility] and saw GI for his abnormal liver imaging.    07/28/2022 MRI of the abdomen with and without contrast with MR elastography of 2.12K PA.  Normal morphology of the liver.    11/28/2022 CBC with a hemoglobin normal at 13.5.,  Normal CMP and TSH.  Discussed some black stool/melena at the time he was referred here.    Today, the patient presents to clinic accompanied by his wife, who assists with history.  He tells me that 3 to 4 weeks ago he had some dark stool and then 1 episode of a charcoal looking melenic stool.  He has not seen any since then but has noted that he has had a lot of excess gas in his system even after drinking just water and a lot of "churning" in there.  Describes a distant history of H. pylori and being treated with "multiple medicines"  and also question of an ulcer from NSAID use back about 5 to 10 years ago.  This was when he was following with Dr. Matthias Hughs.  He has really had no issues since then.  He does not use NSAIDs anymore.  Not on a PPI.     EGD (Nandigam) Mar 2024 - nml.  Bx neg for H pylori Patient reports no further black stool since that brief episode in 2024  _____________   Karl [name] want to talk today about the timing of his next colonoscopy.  He is feeling well, currently denies constipation, diarrhea rectal bleeding or abdominal pain. He had some concerns about  his brother's history of a sessile serrated polyp (which he knows from having accompanied his brother to a colonoscopy done with me in 2023), his mother's history of ovarian cancer and his personal history of prostate cancer.  With all this send reports that he has read about an increasing incidence of colorectal cancer relatively under people, he was questioning the interval for next colonoscopy.   ROS:  Review of Systems He denies chest pain dyspnea or dysuria  Past Medical History: Past Medical History:  Diagnosis Date   Colon polyps    Hypercholesteremia    Prostate cancer (HCC) 04/2016      Past Surgical History: Past Surgical History:  Procedure Laterality Date   PROSTATECTOMY  04/2016   UPPER GASTROINTESTINAL ENDOSCOPY  12/24/2022     Family History: Family History  Problem Relation Age of Onset   Heart attack Mother   Brother SSP Dad had polyps Mother died from overian cancer  Social History: Social History   Socioeconomic History   Marital status: Married    Spouse name: Not on file   Number of children: 0   Years of education: Not on file   Highest education level: Not on file  Occupational History   Occupation: RN  Tobacco Use   Smoking status: Never   Smokeless tobacco: Never  Vaping Use   Vaping status: Never Used  Substance and Sexual Activity   Alcohol use: No   Drug use: No   Sexual activity: Not on file  Other Topics Concern   Not on file  Social History Narrative   Not on file   Social Drivers of Health   Financial Resource Strain: Not on file  Food Insecurity: Not on file  Transportation Needs: Not on file  Physical Activity: Sufficiently Active (07/27/2018)   Received from [facility]   Exercise Vital Sign    Days of Exercise per Week: 5 days    Minutes of Exercise per Session: 120 min  Stress: Not on file  Social Connections: Not on file   [place]  Works in the [employer]   Allergies: Allergies  Allergen Reactions   Penicillins Rash    Outpatient Meds: Current Outpatient Medications  Medication Sig Dispense Refill   Azelastine HCl 137 MCG/SPRAY SOLN Place 1 spray into both nostrils 2 (two) times daily.     colestipol (COLESTID) 1 g tablet Take 1 tablet (1 g total) by mouth 2 (two) times daily. 180 tablet 1   Cyanocobalamin (B-12) 1000 MCG CAPS      ezetimibe-simvastatin (VYTORIN) 10-40 MG tablet Take 1 tablet by mouth at bedtime. 90 tablet 3   fluticasone (FLONASE) 50 MCG/ACT nasal spray Place into the nose as needed.     hydrocortisone 2.5 %  cream SMARTSIG:Sparingly Topical Twice Daily PRN     levocetirizine (XYZAL) 5 MG tablet SMARTSIG:1 Tablet(s) By Mouth Every Evening     metoprolol tartrate (LOPRESSOR) 25 MG tablet Take 1 tablet by mouth up to twice daily only as needed 180 tablet 0   pantoprazole (PROTONIX) 40 MG tablet Take 1 tablet (40 mg total) by mouth daily. 90 tablet 0   No current facility-administered medications for this visit.      ___________________________________________________________________ Objective   Exam:  BP 124/70   Pulse 72   Ht 5\' 7"  (1.702 m)   Wt 149 lb (67.6 kg)   BMI 23.34 kg/m  Wt Readings from Last 3 Encounters:  12/29/23 149  lb (67.6 kg)  12/02/23 150 lb (68 kg)  05/21/23 147 lb (66.7 kg)    Appears well No additional exam, entire visit spent in review of records and plan  Previous endoscopic lab and other records reviewed as noted above in the APP's note from Mar 2024   Encounter Diagnosis  Name Primary?   Hx of colonic polyps Yes   Karl [name] had an adenomatous colon [place] in his 5s, then no polyps reported on the 2016 colonoscopy at [facility] with Dr. Matthias Hughs. No polyps on November 2020 colonoscopy with Dr. Lavon Paganini.  Given his personal polyp history and family history of colon polyps, I think a 5-year interval would be reasonable and I offer that to him. We put him on for recall later this year and he was glad to have the discussion today.   Thank you for the courtesy of this consult.  Please call me with any questions or concerns.  Charlie Pitter [name]  CC: Referring provider noted above

## 2023-12-29 NOTE — Patient Instructions (Signed)
 _______________________________________________________  If your blood pressure at your visit was 140/90 or greater, please contact your primary care physician to follow up on this.  _______________________________________________________  If you are age 69 or older, your body mass index should be between 23-30. Your Body mass index is 23.34 kg/m. If this is out of the aforementioned range listed, please consider follow up with your Primary Care Provider.  If you are age 102 or younger, your body mass index should be between 19-25. Your Body mass index is 23.34 kg/m. If this is out of the aformentioned range listed, please consider follow up with your Primary Care Provider.   ________________________________________________________  The [facility] providers would like to encourage you to use [facility] to communicate with providers for non-urgent requests or questions.  Due to long hold times on the telephone, sending your provider a message by [facility] may be a faster and more efficient way to get a response.  Please allow 48 business hours for a response.  Please remember that this is for non-urgent requests.  _______________________________________________________  We have put you in our system for a colonoscopy recall for 07/2024. We will contact you when that time gets closer.  I appreciate the opportunity to care for you. Karl Jupiter, MD

## 2024-01-12 DIAGNOSIS — K080 Exfoliation of teeth due to systemic causes: Secondary | ICD-10-CM | POA: Diagnosis not present

## 2024-01-15 ENCOUNTER — Other Ambulatory Visit: Payer: Self-pay | Admitting: Medical Genetics

## 2024-01-25 ENCOUNTER — Telehealth: Payer: Self-pay

## 2024-01-25 ENCOUNTER — Other Ambulatory Visit (HOSPITAL_COMMUNITY): Payer: Self-pay

## 2024-01-25 NOTE — Telephone Encounter (Signed)
 Pharmacy Patient Advocate Encounter   Received notification from  Patient Insurance  that prior authorization for Colestipol  HCL 1GM tablet is required/requested.   Insurance verification completed.   The patient is insured through [facility] .   Per test claim: PA required; PA submitted to above mentioned insurance via Fax Key/confirmation #/EOC [employer] [phone] Status is pending

## 2024-01-26 NOTE — Telephone Encounter (Signed)
 [employer] called to advise that Colestipol  has been approved with effective date 01/19/2024-01/18/2025

## 2024-02-01 DIAGNOSIS — K080 Exfoliation of teeth due to systemic causes: Secondary | ICD-10-CM | POA: Diagnosis not present

## 2024-02-02 DIAGNOSIS — K080 Exfoliation of teeth due to systemic causes: Secondary | ICD-10-CM | POA: Diagnosis not present

## 2024-02-10 ENCOUNTER — Ambulatory Visit (INDEPENDENT_AMBULATORY_CARE_PROVIDER_SITE_OTHER)

## 2024-02-10 VITALS — Ht 67.0 in | Wt 149.0 lb

## 2024-02-10 DIAGNOSIS — Z0000 Encounter for general adult medical examination without abnormal findings: Secondary | ICD-10-CM | POA: Diagnosis not present

## 2024-02-10 DIAGNOSIS — Z1159 Encounter for screening for other viral diseases: Secondary | ICD-10-CM

## 2024-02-10 NOTE — Patient Instructions (Addendum)
 [name] , Thank you for taking time out of your busy schedule to complete your Annual Wellness Visit with me. I enjoyed our conversation and look forward to speaking with you again next year. I, as well as your care team,  appreciate your ongoing commitment to your health goals. Please review the following plan we discussed and let me know if I can assist you in the future. Your Game plan/ To Do List   Follow up Visits: Next Medicare AWV with our clinical staff: 02/13/2025 by Video    Have you seen your provider in the last 6 months (3 months if uncontrolled diabetes)? Yes Next Office Visit with your provider: 11/2024  Clinician Recommendations:  Aim for 30 minutes of exercise or brisk walking, 6-8 glasses of water, and 5 servings of fruits and vegetables each day. Patient will bring up-to-date immunization record to PCP to update records.  (Pneumonia and 2nd Shingles vaccines).        This is a list of the screening recommended for you and due dates:  Health Maintenance  Topic Date Due   Hepatitis C Screening  Never done   Pneumonia Vaccine (1 of 2 - PCV) Never done   Zoster (Shingles) Vaccine (2 of 2) 09/10/2018   COVID-19 Vaccine (4 - 2024-25 season) 05/31/2023   Flu Shot  04/29/2024   Colon Cancer Screening  08/16/2024   Medicare Annual Wellness Visit  02/09/2025   DTaP/Tdap/Td vaccine (3 - Td or Tdap) 01/30/2030   HPV Vaccine  Aged Out   Meningitis B Vaccine  Aged Out    Advanced directives: (Copy Requested) Please bring a copy of your health care power of attorney and living will to the office to be added to your chart at your convenience. You can mail to [facility] [address] [place], [place] [address] or email to [email or web address] Advance Care Planning is important because it:  [x]  Makes sure you receive the medical care that is consistent with your values, goals, and preferences  [x]  It provides guidance to your family and loved ones and  reduces their decisional burden about whether or not they are making the right decisions based on your wishes.  Follow the link provided in your after visit summary or read over the paperwork we have mailed to you to help you started getting your Advance Directives in place. If you need assistance in completing these, please reach out to us  so that we can help you!

## 2024-02-10 NOTE — Progress Notes (Signed)
 Subjective:   Please attest and cosign this visit due to patients primary care provider not being in the office at the time the visit was completed.  (Pt of Dr. Bambi Lever)  Karl [name] is a 68 y.o. who presents for a Medicare Wellness preventive visit.  As a reminder, Annual Wellness Visits don't include a physical exam, and some assessments may be limited, especially if this visit is performed virtually. We may recommend an in-person visit if needed.  Visit Complete: Virtual I connected with  Karl [name] on 02/10/24 by a video and audio enabled telemedicine application and verified that I am speaking with the correct person using two identifiers.  Patient Location: Home  Provider Location: Office/Clinic  I discussed the limitations of evaluation and management by telemedicine. The patient expressed understanding and agreed to proceed.  Vital Signs: Because this visit was a virtual/telehealth visit, some criteria may be missing or patient reported. Any vitals not documented were not able to be obtained and vitals that have been documented are patient reported.   Persons Participating in Visit: Patient.  AWV Questionnaire: No: Patient Medicare AWV questionnaire was not completed prior to this visit.  Cardiac Risk Factors include: advanced age (>6men, >32 women);dyslipidemia;male gender     Objective:     Today's Vitals   02/10/24 1506  Weight: 149 lb (67.6 kg)  Height: 5\' 7"  (1.702 m)   Body mass index is 23.34 kg/m.     02/10/2024    3:06 PM  Advanced Directives  Does Patient Have a Medical Advance Directive? Yes  Type of Estate agent of [place];Living will  Copy of Healthcare Power of Attorney in Chart? No - copy requested    Current Medications (verified) Outpatient Encounter Medications as of 02/10/2024  Medication Sig   Azelastine HCl 137 MCG/SPRAY SOLN Place 1 spray into both nostrils 2 (two) times daily.   colestipol   (COLESTID ) 1 g tablet Take 1 tablet (1 g total) by mouth 2 (two) times daily.   Cyanocobalamin (B-12) 1000 MCG CAPS    ezetimibe -simvastatin  (VYTORIN ) 10-40 MG tablet Take 1 tablet by mouth at bedtime.   fluticasone (FLONASE) 50 MCG/ACT nasal spray Place into the nose as needed.   hydrocortisone 2.5 % cream SMARTSIG:Sparingly Topical Twice Daily PRN   levocetirizine (XYZAL) 5 MG tablet SMARTSIG:1 Tablet(s) By Mouth Every Evening   metoprolol  tartrate (LOPRESSOR ) 25 MG tablet Take 1 tablet by mouth up to twice daily only as needed   pantoprazole  (PROTONIX ) 40 MG tablet Take 1 tablet (40 mg total) by mouth daily.   No facility-administered encounter medications on file as of 02/10/2024.    Allergies (verified) Penicillins   History: Past Medical History:  Diagnosis Date   Colon polyps    Hypercholesteremia    Prostate cancer (HCC) 04/2016   Past Surgical History:  Procedure Laterality Date   PROSTATECTOMY  04/2016   UPPER GASTROINTESTINAL ENDOSCOPY  12/24/2022   Family History  Problem Relation Age of Onset   Heart attack Mother    Social History   Socioeconomic History   Marital status: Married    Spouse name: Not on file   Number of children: 0   Years of education: Not on file   Highest education level: Not on file  Occupational History   Occupation: RN  Tobacco Use   Smoking status: Never    Passive exposure: Never   Smokeless tobacco: Never  Vaping Use   Vaping status: Never Used  Substance and Sexual  Activity   Alcohol use: No   Drug use: No   Sexual activity: Not on file  Other Topics Concern   Not on file  Social History Narrative   Not on file   Social Drivers of Health   Financial Resource Strain: Low Risk  (02/10/2024)   Overall Financial Resource Strain (CARDIA)    Difficulty of Paying Living Expenses: Not hard at all  Food Insecurity: No Food Insecurity (02/10/2024)   Hunger Vital Sign    Worried About Running Out of Food in the Last Year: Never  true    Ran Out of Food in the Last Year: Never true  Transportation Needs: No Transportation Needs (02/10/2024)   PRAPARE - Administrator, Civil Service (Medical): No    Lack of Transportation (Non-Medical): No  Physical Activity: Sufficiently Active (02/10/2024)   Exercise Vital Sign    Days of Exercise per Week: 3 days    Minutes of Exercise per Session: 130 min  Stress: No Stress Concern Present (02/10/2024)   [employer] of Occupational Health - Occupational Stress Questionnaire    Feeling of Stress : Not at all  Social Connections: Socially Integrated (02/10/2024)   Social Connection and Isolation Panel [NHANES]    Frequency of Communication with Friends and Family: More than three times a week    Frequency of Social Gatherings with Friends and Family: More than three times a week    Attends Religious Services: More than 4 times per year    Active Member of [employer] or Organizations: Yes    Attends Engineer, structural: More than 4 times per year    Marital Status: Married    Tobacco Counseling Counseling given: No    Clinical Intake:  Pre-visit preparation completed: Yes  Pain : No/denies pain     BMI - recorded: 23.34 Nutritional Risks: None Diabetes: No  No results found for: "HGBA1C"   How often do you need to have someone help you when you read instructions, pamphlets, or other written materials from your doctor or pharmacy?: 1 - Never  Interpreter Needed?: No  Information entered by :: Kandy Orris, CMA   Activities of Daily Living     02/10/2024    3:09 PM  In your present state of health, do you have any difficulty performing the following activities:  Hearing? 0  Vision? 0  Difficulty concentrating or making decisions? 0  Walking or climbing stairs? 0  Dressing or bathing? 0  Doing errands, shopping? 0  Preparing Food and eating ? N  Using the Toilet? N  In the past six months, have you accidently leaked urine? N  Do  you have problems with loss of bowel control? N  Managing your Medications? N  Managing your Finances? N  Housekeeping or managing your Housekeeping? N    Patient Care Team: Adelia Homestead, MD as PCP - General (Internal Medicine) Arnoldo Lapping, MD as PCP - Cardiology (Cardiology) Patient, No Pcp Per (General Practice)  Indicate any recent Medical Services you may have received from other than [facility] providers in the past year (date may be approximate).     Assessment:    This is a routine wellness examination for Kem[place]ri.  Hearing/Vision screen Hearing Screening - Comments:: Denies hearing difficulties   Vision Screening - Comments:: Wears rx glasses - up to date with routine eye exams with Ophthalmologist in [facility], [place]    Goals Addressed  This Visit's Progress     Patient Stated (pt-stated)        Patient stated he would like to stay active and watch diet.  Enjoys working in the garden.       Depression Screen     02/10/2024    3:10 PM 12/02/2023    8:07 AM  PHQ 2/9 Scores  PHQ - 2 Score 0 0  PHQ- 9 Score 0     Fall Risk     02/10/2024    3:10 PM 12/02/2023    8:06 AM  Fall Risk   Falls in the past year? 0 0  Number falls in past yr: 0 0  Injury with Fall? 0 0  Risk for fall due to : No Fall Risks   Follow up Falls prevention discussed;Falls evaluation completed Falls evaluation completed    MEDICARE RISK AT HOME:  Medicare Risk at Home Any stairs in or around the home?: No If so, are there any without handrails?: No Home free of loose throw rugs in walkways, pet beds, electrical cords, etc?: Yes Adequate lighting in your home to reduce risk of falls?: Yes Life alert?: No Use of a cane, walker or w/c?: No Grab bars in the bathroom?: Yes Shower chair or bench in shower?: Yes Elevated toilet seat or a handicapped toilet?: No  TIMED UP AND GO:  Was the test performed?  No  Cognitive Function: 6CIT completed        02/10/2024     3:14 PM  6CIT Screen  What Year? 0 points  What month? 0 points  What time? 0 points  Count back from 20 0 points  Months in reverse 0 points  Repeat phrase 0 points  Total Score 0 points    Immunizations Immunization History  Administered Date(s) Administered   Influenza Inj Mdck Quad Pf 08/01/2020   Influenza Split 06/27/2015, 08/02/2020   Influenza, High Dose Seasonal PF 07/25/2021   Influenza,inj,Quad PF,6+ Mos 06/27/2018, 06/04/2019   Influenza-Unspecified 07/11/2017   PFIZER(Purple Top)SARS-COV-2 Vaccination 09/24/2019, 10/14/2019, 07/06/2020   Tdap 05/11/2009, 01/31/2020   Zoster Recombinant(Shingrix) 07/08/2018   Zoster, Live 04/14/2018, 07/16/2018    Screening Tests Health Maintenance  Topic Date Due   Hepatitis C Screening  Never done   Pneumonia Vaccine 68+ Years old (1 of 2 - PCV) Never done   Zoster Vaccines- Shingrix (2 of 2) 09/10/2018   COVID-19 Vaccine (4 - 2024-25 season) 05/31/2023   INFLUENZA VACCINE  04/29/2024   Colonoscopy  08/16/2024   Medicare Annual Wellness (AWV)  02/09/2025   DTaP/Tdap/Td (3 - Td or Tdap) 01/30/2030   HPV VACCINES  Aged Out   Meningococcal B Vaccine  Aged Out    Health Maintenance  Health Maintenance Due  Topic Date Due   Hepatitis C Screening  Never done   Pneumonia Vaccine 15+ Years old (1 of 2 - PCV) Never done   Zoster Vaccines- Shingrix (2 of 2) 09/10/2018   COVID-19 Vaccine (4 - 2024-25 season) 05/31/2023   Health Maintenance Items Addressed:   Patient will bring up-to-date immunization record to PCP to update records.  (Pneumonia and 2nd Shingles vaccines).          Additional Screening:  Vision Screening: Recommended annual ophthalmology exams for early detection of glaucoma and other disorders of the eye.  Dental Screening: Recommended annual dental exams for proper oral hygiene  Community Resource Referral / Chronic Care Management: CRR required this visit?  No   CCM required this visit?  No   Plan:    I have personally reviewed and noted the following in the patient's chart:   Medical and social history Use of alcohol, tobacco or illicit drugs  Current medications and supplements including opioid prescriptions. Patient is not currently taking opioid prescriptions. Functional ability and status Nutritional status Physical activity Advanced directives List of other physicians Hospitalizations, surgeries, and ER visits in previous 12 months Vitals Screenings to include cognitive, depression, and falls Referrals and appointments  In addition, I have reviewed and discussed with patient certain preventive protocols, quality metrics, and best practice recommendations. A written personalized care plan for preventive services as well as general preventive health recommendations were provided to patient.   Patria Bookbinder, CMA   02/10/2024   After Visit Summary: Due to this being a Video visit, patient will review information via MyChart.   Notes: Nothing significant to report at this time.

## 2024-03-18 ENCOUNTER — Other Ambulatory Visit (HOSPITAL_COMMUNITY)
Admission: RE | Admit: 2024-03-18 | Discharge: 2024-03-18 | Disposition: A | Discharge status: Home / Self Care | Payer: Self-pay | Source: Ambulatory Visit | Attending: Oncology | Admitting: Oncology

## 2024-03-18 ENCOUNTER — Other Ambulatory Visit (HOSPITAL_COMMUNITY)
Admission: RE | Admit: 2024-03-18 | Discharge: 2024-03-18 | Disposition: A | Discharge status: Home / Self Care | Attending: Internal Medicine | Admitting: Internal Medicine

## 2024-03-18 DIAGNOSIS — Z006 Encounter for examination for normal comparison and control in clinical research program: Secondary | ICD-10-CM | POA: Insufficient documentation

## 2024-03-18 DIAGNOSIS — Z1159 Encounter for screening for other viral diseases: Secondary | ICD-10-CM | POA: Insufficient documentation

## 2024-03-19 LAB — HCV INTERPRETATION

## 2024-03-19 LAB — HCV AB W REFLEX TO QUANT PCR: HCV Ab: NONREACTIVE

## 2024-03-21 ENCOUNTER — Ambulatory Visit: Payer: Self-pay | Admitting: Internal Medicine

## 2024-03-21 ENCOUNTER — Other Ambulatory Visit: Payer: Self-pay | Admitting: Medical Genetics

## 2024-03-21 DIAGNOSIS — Z006 Encounter for examination for normal comparison and control in clinical research program: Secondary | ICD-10-CM

## 2024-03-29 LAB — GENECONNECT MOLECULAR SCREEN: Genetic Analysis Overall Interpretation: NEGATIVE

## 2024-04-18 ENCOUNTER — Other Ambulatory Visit: Payer: Self-pay | Admitting: *Deleted

## 2024-04-18 MED ORDER — EZETIMIBE-SIMVASTATIN 10-40 MG PO TABS: 1.0000 | ORAL_TABLET | Freq: Every day | ORAL | 0 refills | Status: DC

## 2024-04-25 ENCOUNTER — Other Ambulatory Visit: Payer: Self-pay

## 2024-04-25 MED ORDER — COLESTIPOL HCL 1 G PO TABS: 1.0000 g | ORAL_TABLET | Freq: Two times a day (BID) | ORAL | 1 refills | Status: AC

## 2024-04-25 NOTE — Telephone Encounter (Signed)
 Colestipol  refilled to mail order pharmacy. [place] is up to date on his visits.

## 2024-04-26 NOTE — Progress Notes (Unsigned)
 Cardiology Office Note:    Date:  04/27/2024   ID:  Karl [name], DOB Jul 25, 1956, MRN [id number]  PCP:  Rollene Almarie LABOR, MD   [facility] Providers Cardiologist:  Ozell Fell, MD     Referring MD: Rollene Almarie LABOR, *   Chief Complaint  Patient presents with   Follow-up    Palpitations     History of Present Illness:    Karl [name] is a 68 y.o. male with a hx of heart palpitations and mixed hyperlipidemia, presenting for follow-up evaluation. The patient has been seen on an annual basis. He has undergone cardiac testing in the past that includes a gated coronary CTA which showed a coronary calcium score of 0 and no evidence of coronary atherosclerosis. He has undergone outpatient monitoring, demonstrating few short runs of SVT, but normal sinus rhythm with a low PVC burden of less than 1%.   The patient is here with his wife today.  He has been doing well and enjoying retirement.  He is exercising regularly with no exertional symptoms.  Today, he denies chest pain, chest pressure, or shortness of breath.  He has occasional heart palpitations, especially when lying on his left side.  These are unchanged and pattern in fact are less noticeable than when he was under more stress in the past.  No other complaints noted today.  Current Medications: Current Meds  Medication Sig   Azelastine HCl 137 MCG/SPRAY SOLN Place 1 spray into both nostrils 2 (two) times daily.   colestipol  (COLESTID ) 1 g tablet Take 1 tablet (1 g total) by mouth 2 (two) times daily.   Cyanocobalamin (B-12) 1000 MCG CAPS    ezetimibe -simvastatin  (VYTORIN ) 10-40 MG tablet Take 1 tablet by mouth at bedtime.   fluticasone (FLONASE) 50 MCG/ACT nasal spray Place into the nose as needed.   hydrocortisone 2.5 % cream SMARTSIG:Sparingly Topical Twice Daily PRN   levocetirizine (XYZAL) 5 MG tablet SMARTSIG:1 Tablet(s) By Mouth Every Evening   metoprolol  tartrate (LOPRESSOR ) 25 MG tablet Take 1  tablet by mouth up to twice daily only as needed   pantoprazole  (PROTONIX ) 40 MG tablet Take 1 tablet (40 mg total) by mouth daily.     Allergies:   Penicillins   ROS:   Please see the history of present illness.    All other systems reviewed and are negative.  EKGs/Labs/Other Studies Reviewed:    The following studies were reviewed today: Cardiac Studies & Procedures   ______________________________________________________________________________________________     ECHOCARDIOGRAM  ECHOCARDIOGRAM COMPLETE 01/16/2022  Narrative ECHOCARDIOGRAM REPORT    Patient Name:   Karl [name] Date of Exam: 01/16/2022 Medical Rec #:  [id number]     Height:       67.0 in Accession #:    [id number]    Weight:       143.0 lb Date of Birth:  11/09/1955     BSA:          1.754 m Patient Age:    66 years      BP:           134/76 mmHg Patient Gender: M             HR:           67 bpm. Exam Location:  [address]  Procedure: 3D Echo, 2D Echo, Cardiac Doppler, Color Doppler and Strain Analysis  Indications:    R00.2 Palpitation  History:        Patient has no prior history  of Echocardiogram examinations. Arrythmias:Palpitation; Risk Factors:Dyslipidemia.  Sonographer:    Marshia Lawyer BS, RDCS Referring Phys: [phone] Tammera Engert  IMPRESSIONS   1. Left ventricular ejection fraction, by estimation, is 60 to 65%. Left ventricular ejection fraction by 3D volume is 57 %. The left ventricle has normal function. The left ventricle has no regional wall motion abnormalities. Left ventricular diastolic parameters were normal. 2. Right ventricular systolic function is normal. The right ventricular size is normal. 3. The mitral valve is normal in structure. No evidence of mitral valve regurgitation. No evidence of mitral stenosis. 4. The aortic valve is normal in structure. Aortic valve regurgitation is not visualized. No aortic stenosis is present. 5. The inferior vena cava is normal in size with  greater than 50% respiratory variability, suggesting right atrial pressure of 3 mmHg.  FINDINGS Left Ventricle: Left ventricular ejection fraction, by estimation, is 60 to 65%. Left ventricular ejection fraction by 3D volume is 57 %. The left ventricle has normal function. The left ventricle has no regional wall motion abnormalities. The left ventricular internal cavity size was normal in size. There is no left ventricular hypertrophy. Left ventricular diastolic parameters were normal.  Right Ventricle: The right ventricular size is normal. No increase in right ventricular wall thickness. Right ventricular systolic function is normal.  Left Atrium: Left atrial size was normal in size.  Right Atrium: Right atrial size was normal in size.  Pericardium: There is no evidence of pericardial effusion.  Mitral Valve: The mitral valve is normal in structure. No evidence of mitral valve regurgitation. No evidence of mitral valve stenosis.  Tricuspid Valve: The tricuspid valve is normal in structure. Tricuspid valve regurgitation is trivial. No evidence of tricuspid stenosis.  Aortic Valve: The aortic valve is normal in structure. Aortic valve regurgitation is not visualized. Aortic regurgitation PHT measures 209 msec. No aortic stenosis is present.  Pulmonic Valve: The pulmonic valve was normal in structure. Pulmonic valve regurgitation is not visualized. No evidence of pulmonic stenosis.  Aorta: The aortic root is normal in size and structure.  Venous: The inferior vena cava is normal in size with greater than 50% respiratory variability, suggesting right atrial pressure of 3 mmHg.  IAS/Shunts: No atrial level shunt detected by color flow Doppler.   LEFT VENTRICLE PLAX 2D LVIDd:         4.40 cm         Diastology LVIDs:         2.80 cm         LV e' medial:    10.10 cm/s LV PW:         0.90 cm         LV E/e' medial:  8.2 LV IVS:        0.90 cm         LV e' lateral:   14.00 cm/s LVOT  diam:     2.00 cm         LV E/e' lateral: 5.9 LV SV:         77 LV SV Index:   44              2D LVOT Area:     3.14 cm        Longitudinal Strain 2D Strain GLS  -16.1 % (A2C): 2D Strain GLS  -20.5 % (A3C): 2D Strain GLS  -22.8 % (A4C): 2D Strain GLS  -19.8 % Avg:  3D Volume EF LV 3D EF:    Left ventricul  ar ejection fraction by 3D volume is 57 %.  3D Volume EF: 3D EF:        57 % LV EDV:       118 ml LV ESV:       51 ml LV SV:        67 ml  RIGHT VENTRICLE             IVC RV Basal diam:  4.10 cm     IVC diam: 1.80 cm RV Mid diam:    3.40 cm RV S prime:     10.30 cm/s TAPSE (M-mode): 2.2 cm  LEFT ATRIUM             Index        RIGHT ATRIUM           Index LA diam:        3.30 cm 1.88 cm/m   RA Pressure: 3.00 mmHg LA Vol (A2C):   31.9 ml 18.19 ml/m  RA Area:     20.20 cm LA Vol (A4C):   24.5 ml 13.97 ml/m  RA Volume:   64.10 ml  36.55 ml/m LA Biplane Vol: 29.0 ml 16.54 ml/m AORTIC VALVE LVOT Vmax:   114.00 cm/s LVOT Vmean:  73.400 cm/s LVOT VTI:    0.244 m AI PHT:      209 msec  AORTA Ao Root diam: 2.90 cm Ao Asc diam:  2.90 cm  MITRAL VALVE               TRICUSPID VALVE Estimated RAP:  3.00 mmHg MV Decel Time: 208 msec MV E velocity: 82.80 cm/s  SHUNTS MV A velocity: 70.20 cm/s  Systemic VTI:  0.24 m MV E/A ratio:  1.18        Systemic Diam: 2.00 cm  Kardie Tobb DO Electronically signed by Dub Huntsman DO Signature Date/Time: 01/16/2022/2:58:16 PM    Final    MONITORS  LONG TERM MONITOR (3-14 DAYS) 02/06/2022  Narrative Patch Wear Time:  3 days and 14 hours (2023-05-01T10:57:08-399 to 2023-05-05T01:56:45-0400)  Patient had a min HR of 50 bpm, max HR of 174 bpm, and avg HR of 70 bpm. Predominant underlying rhythm was Sinus Rhythm. 7 Supraventricular Tachycardia runs occurred, the run with the fastest interval lasting 7 beats with a max rate of 174 bpm, the longest lasting 11.6 secs with an avg rate of 156 bpm. Supraventricular  Tachycardia was detected within +/- 45 seconds of symptomatic patient event(s). Isolated SVEs were rare (<1.0%), SVE Couplets were rare (<1.0%), and SVE Triplets were rare (<1.0%). Isolated VEs were rare (<1.0%), and no VE Couplets or VE Triplets were present.  The basic rhythm is normal sinus with an average HR of 70 bpm. There are rare PVC's, PAC's, and short supraventricular runs as outlined above. No AFib or flutter. No bradyarrhythmias.   CT SCANS  CT CORONARY MORPH W/CTA COR W/SCORE 12/27/2020  Addendum 12/28/2020 10:55 AM ADDENDUM REPORT: 12/28/2020 10:52  EXAM: OVER-READ INTERPRETATION  CT CHEST  The following report is an over-read performed by radiologist Dr. Selinda Saas [facility] on 12/28/2020. This over-read does not include interpretation of cardiac or coronary anatomy or pathology. The coronary CTA interpretation by the cardiologist is attached.  COMPARISON:  None.  FINDINGS: Cardiovascular: Normal heart size. No significant pericardial effusion/thickening. Great vessels are normal in course and caliber. No central pulmonary emboli.  Mediastinum/Nodes: Unremarkable esophagus. No pathologically enlarged mediastinal or hilar lymph nodes.  Lungs/Pleura: No pneumothorax. No pleural effusion.  No acute consolidative airspace disease or lung masses. Two small solid anterior left lower lobe pulmonary nodules, largest 3 mm (series 12/image 7).  Upper abdomen: No acute abnormality.  Musculoskeletal: No aggressive appearing focal osseous lesions. Mild thoracic spondylosis.  IMPRESSION: Two small solid anterior left lower lobe pulmonary nodules, largest 3 mm. No follow-up needed if patient is low-risk (and has no known or suspected primary neoplasm). Non-contrast chest CT can be considered in 12 months if patient is high-risk. This recommendation follows the consensus statement: Guidelines for Management of Incidental Pulmonary Nodules Detected on CT  Images: From the Fleischner Society 2017; Radiology 2017; 284:228-243.   Electronically Signed By: Selinda DELENA Blue M.D. On: 12/28/2020 10:52  Narrative HISTORY: 68 yo male with ECG abnormal, 67yr CHD risk 10-20%, treadmill candidate  EXAM: Cardiac/Coronary CTA  TECHNIQUE: The patient was scanned on a [employer].  PROTOCOL: A 120 kV prospective scan was triggered in the descending thoracic aorta at 111 HU's. Axial non-contrast 3 mm slices were carried out through the heart. The data set was analyzed on a dedicated work station and scored using the Agatson method. Gantry rotation speed was 250 msecs and collimation was .6 mm. Beta blockade and 0.8 mg of sl NTG was given. The 3D data set was reconstructed in 5% intervals of the 67-82 % of the R-R cycle. Diastolic phases were analyzed on a dedicated work station using MPR, MIP and VRT modes. The patient received of contrast.  FINDINGS: Quality: Excellent, HR 65  Coronary calcium score: The patient's coronary artery calcium score is 0, which places the patient in the 0 percentile.  Coronary arteries: Normal coronary origins.  Right dominance.  Right Coronary Artery: Dominant.  No disease.  Left Main Coronary Artery: Normal. Bifurcates into the LAD and LCx arteries.  Left Anterior Descending Coronary Artery: Large anterior vessel that wraps around the apex. No disease. 2 small diagonal branches without stenosis.  Left Circumflex Artery: AV groove LCX without stenosis. There is a large high OM1 branch without disease.  Aorta: Normal size, 27 mm at the mid ascending aorta (level of the PA bifurcation) measured double oblique. Aortic atherosclerosis. No dissection.  Aortic Valve: Trileaflet.  Punctate calcifications.  Other findings:  Normal pulmonary vein drainage into the left atrium.  Normal left atrial appendage without a thrombus.  Normal size of the pulmonary artery.  Punctate mitral annular  calcification.  IMPRESSION: 1. No evidence of CAD, CADRADS = 0.  2. Coronary calcium score of 0. This was 0 percentile for age and sex matched control.  3. Normal coronary origin with right dominance.  4. Punctate aortic annular and mitral valvular calcification.  5. Aortic atherosclerosis.  6. Cardiac risk factor modification is recommended.  Electronically Signed: By: Vinie JAYSON Maxcy M.D. On: 12/27/2020 17:28     ______________________________________________________________________________________________      EKG:   EKG Interpretation Date/Time:  Wednesday April 27 2024 08:36:00 EDT Ventricular Rate:  64 PR Interval:  132 QRS Duration:  86 QT Interval:  420 QTC Calculation: 433 R Axis:   43  Text Interpretation: Normal sinus rhythm T wave abnormality, consider lateral ischemia No previous ECGs available Confirmed by Wonda Sharper [phone]) on 04/27/2024 8:56:32 AM    Recent Labs: 12/02/2023: ALT 16; Hemoglobin 14.3; Platelets 267.0 12/03/2023: BUN 10; Creatinine, Ser 0.96; Potassium 5.3 No hemolysis seen; Sodium 139  Recent Lipid Panel    Component Value Date/Time   CHOL 140 12/02/2023 0835   TRIG 82.0 12/02/2023 0835   HDL  56.90 12/02/2023 0835   CHOLHDL 2 12/02/2023 0835   VLDL 16.4 12/02/2023 0835   LDLCALC 67 12/02/2023 0835     Risk Assessment/Calculations:                Physical Exam:    VS:  BP 120/80   Pulse 67   Ht 5' 7 (1.702 m)   Wt 146 lb 6.4 oz (66.4 kg)   SpO2 98%   BMI 22.93 kg/m     Wt Readings from Last 3 Encounters:  04/27/24 146 lb 6.4 oz (66.4 kg)  02/10/24 149 lb (67.6 kg)  12/29/23 149 lb (67.6 kg)     GEN:  Well nourished, well developed in no acute distress HEENT: Normal NECK: No JVD; No carotid bruits LYMPHATICS: No lymphadenopathy CARDIAC: RRR, no murmurs, rubs, gallops RESPIRATORY:  Clear to auscultation without rales, wheezing or rhonchi  ABDOMEN: Soft, non-tender, non-distended MUSCULOSKELETAL:  No edema;  No deformity  SKIN: Warm and dry NEUROLOGIC:  Alert and oriented x 3 PSYCHIATRIC:  Normal affect   Assessment & Plan Palpitations Stable, past monitor showed isolated PVCs and PACs with no sustained arrhythmia.  Normal LV function by echo with no structural heart disease.  No ischemic symptoms.  Normal coronaries noted on gated cardiac CTA.  Continue metoprolol  as needed. Mixed hyperlipidemia Lipids are excellent on multidrug therapy with simvastatin  and ezetimibe .Cholesterol is 140, LDL 67.  Continue current management.      Medication Adjustments/Labs and Tests Ordered: Current medicines are reviewed at length with the patient today.  Concerns regarding medicines are outlined above.  Orders Placed This Encounter  Procedures   EKG 12-Lead   No orders of the defined types were placed in this encounter.   Patient Instructions  Medication Instructions:  No Changes *If you need a refill on your cardiac medications before your next appointment, please call your pharmacy*  Lab Work: None  Follow-Up: At [facility] you and your health needs are our priority.  As part of our continuing mission to provide you with exceptional heart care, our providers are all part of one team.  This team includes your primary Cardiologist (physician) and Advanced Practice Providers or APPs (Physician Assistants and Nurse Practitioners) who all work together to provide you with the care you need, when you need it.  Your next appointment:   1 year(s)  Provider:   Ozell Fell, MD   Other Instructions Please call us  or send a MyChart message with any Cardiology related questions/concerns.  [phone].  Thank you!        Signed, Ozell Fell, MD  04/27/2024 12:48 PM    [facility]

## 2024-04-27 ENCOUNTER — Encounter: Payer: Self-pay | Admitting: Cardiovascular Disease

## 2024-04-27 ENCOUNTER — Ambulatory Visit: Attending: Cardiovascular Disease | Admitting: Cardiovascular Disease

## 2024-04-27 VITALS — BP 120/80 | HR 67 | Ht 67.0 in | Wt 146.4 lb

## 2024-04-27 DIAGNOSIS — R002 Palpitations: Secondary | ICD-10-CM | POA: Diagnosis not present

## 2024-04-27 DIAGNOSIS — E782 Mixed hyperlipidemia: Secondary | ICD-10-CM

## 2024-04-27 NOTE — Assessment & Plan Note (Signed)
 Stable, past monitor showed isolated PVCs and PACs with no sustained arrhythmia.  Normal LV function by echo with no structural heart disease.  No ischemic symptoms.  Normal coronaries noted on gated cardiac CTA.  Continue metoprolol  as needed.

## 2024-04-27 NOTE — Assessment & Plan Note (Signed)
 Lipids are excellent on multidrug therapy with simvastatin  and ezetimibe .Cholesterol is 140, LDL 67.  Continue current management.

## 2024-04-27 NOTE — Patient Instructions (Signed)
 Medication Instructions:  No Changes *If you need a refill on your cardiac medications before your next appointment, please call your pharmacy*  Lab Work: None  Follow-Up: At [facility], you and your health needs are our priority.  As part of our continuing mission to provide you with exceptional heart care, our providers are all part of one team.  This team includes your primary Cardiologist (physician) and Advanced Practice Providers or APPs (Physician Assistants and Nurse Practitioners) who all work together to provide you with the care you need, when you need it.  Your next appointment:   1 year(s)  Provider:   Ozell Fell, MD   Other Instructions Please call us  or send a MyChart message with any Cardiology related questions/concerns.  [phone].  Thank you!

## 2024-06-06 ENCOUNTER — Ambulatory Visit: Admitting: Cardiovascular Disease

## 2024-06-28 ENCOUNTER — Encounter: Payer: Self-pay | Admitting: Family Medicine

## 2024-06-28 ENCOUNTER — Ambulatory Visit (INDEPENDENT_AMBULATORY_CARE_PROVIDER_SITE_OTHER): Admitting: Family Medicine

## 2024-06-28 ENCOUNTER — Other Ambulatory Visit: Payer: Self-pay

## 2024-06-28 VITALS — BP 122/90 | HR 68 | Ht 67.0 in

## 2024-06-28 DIAGNOSIS — M67431 Ganglion, right wrist: Secondary | ICD-10-CM | POA: Diagnosis not present

## 2024-06-28 DIAGNOSIS — M25531 Pain in right wrist: Secondary | ICD-10-CM | POA: Diagnosis not present

## 2024-06-28 NOTE — Patient Instructions (Signed)
 Drained cyst Watch for signs of infection See me when you need me

## 2024-06-28 NOTE — Assessment & Plan Note (Signed)
 Aspiration done today, tolerated the procedure well, discussed icing regimen and home exercises, discussed icing regimen.  We discussed with patient that this can recur.  Watch for signs of infectious etiology.  I discussed the possible need for surgical intervention if.  Repetitive reaccumulation occurs.  Follow-up again in 6 to 8 weeks otherwise.

## 2024-06-28 NOTE — Progress Notes (Signed)
 [name] [address] [place] [address] Phone: [phone] Subjective:   Karl [name] Karl [name], am serving as a scribe for Dr. Arthea [name].  I'm seeing this patient by the request  of:  Karl [name] LABOR, MD  CC: Right wrist bump  YEP:Karl [name]  Karl [name] is a 68 y.o. male coming in with complaint of R wrist pain. Patient states that he has had pain for past 2 months over ulnar styloid process. Appears to be ganglion cyst. No history of ganglion cyst.     Past Medical History:  Diagnosis Date   Colon polyps    Hypercholesteremia    Prostate cancer (HCC) 04/2016   Past Surgical History:  Procedure Laterality Date   PROSTATECTOMY  04/2016   UPPER GASTROINTESTINAL ENDOSCOPY  12/24/2022   Social History   Socioeconomic History   Marital status: Married    Spouse name: Not on file   Number of children: 0   Years of education: Not on file   Highest education level: Not on file  Occupational History   Occupation: RN  Tobacco Use   Smoking status: Never    Passive exposure: Never   Smokeless tobacco: Never  Vaping Use   Vaping status: Never Used  Substance and Sexual Activity   Alcohol use: No   Drug use: No   Sexual activity: Not on file  Other Topics Concern   Not on file  Social History Narrative   Not on file   Social Drivers of Health   Financial Resource Strain: Low Risk  (02/10/2024)   Overall Financial Resource Strain (CARDIA)    Difficulty of Paying Living Expenses: Not hard at all  Food Insecurity: No Food Insecurity (02/10/2024)   Hunger Vital Sign    Worried About Running Out of Food in the Last Year: Never true    Ran Out of Food in the Last Year: Never true  Transportation Needs: No Transportation Needs (02/10/2024)   PRAPARE - Administrator, Civil Service (Medical): No    Lack of Transportation (Non-Medical): No  Physical Activity: Sufficiently Active (02/10/2024)   Exercise Vital Sign     Days of Exercise per Week: 3 days    Minutes of Exercise per Session: 130 min  Stress: No Stress Concern Present (02/10/2024)   [employer] of Occupational Health - Occupational Stress Questionnaire    Feeling of Stress : Not at all  Social Connections: Socially Integrated (02/10/2024)   Social Connection and Isolation Panel    Frequency of Communication with Friends and Family: More than three times a week    Frequency of Social Gatherings with Friends and Family: More than three times a week    Attends Religious Services: More than 4 times per year    Active Member of [employer] or Organizations: Yes    Attends Engineer, structural: More than 4 times per year    Marital Status: Married   Allergies  Allergen Reactions   Penicillins Rash   Family History  Problem Relation Age of Onset   Heart attack Mother      Current Outpatient Medications (Cardiovascular):    colestipol  (COLESTID ) 1 g tablet, Take 1 tablet (1 g total) by mouth 2 (two) times daily.   ezetimibe -simvastatin  (VYTORIN ) 10-40 MG tablet, Take 1 tablet by mouth at bedtime.   metoprolol  tartrate (LOPRESSOR ) 25 MG tablet, Take 1 tablet by mouth up to twice daily only as needed  Current Outpatient Medications (Respiratory):  Azelastine HCl 137 MCG/SPRAY SOLN, Place 1 spray into both nostrils 2 (two) times daily.   fluticasone (FLONASE) 50 MCG/ACT nasal spray, Place into the nose as needed.   levocetirizine (XYZAL) 5 MG tablet, SMARTSIG:1 Tablet(s) By Mouth Every Evening   Current Outpatient Medications (Hematological):    Cyanocobalamin (B-12) 1000 MCG CAPS,   Current Outpatient Medications (Other):    hydrocortisone 2.5 % cream, SMARTSIG:Sparingly Topical Twice Daily PRN   pantoprazole  (PROTONIX ) 40 MG tablet, Take 1 tablet (40 mg total) by mouth daily.   Reviewed prior external information including notes and imaging from  primary care provider As well as notes that were available from care everywhere  and other healthcare systems.  Past medical history, social, surgical and family history all reviewed in electronic medical record.  No pertanent information unless stated regarding to the chief complaint.   Review of Systems:  No headache, visual changes, nausea, vomiting, diarrhea, constipation, dizziness, abdominal pain, skin rash, fevers, chills, night sweats, weight loss, swollen lymph nodes, body aches, joint swelling, chest pain, shortness of breath, mood changes. POSITIVE muscle aches  Objective  Blood pressure (!) 122/90, pulse 68, height 5' 7 (1.702 m), SpO2 97%.   General: No apparent distress alert and oriented x3 mood and affect normal, dressed appropriately.  HEENT: Pupils equal, extraocular movements intact  Respiratory: Patient's speak in full sentences and does not appear short of breath  Cardiovascular: No lower extremity edema, non tender, no erythema  Right wrist does have a mass noted on the dorsal aspect of the wrist.  Seems to be more ulnar than radial. Tender to palpation and freely movable.  Procedure: Real-time Ultrasound Guided Injection of aspiration and injection of ganglion cyst of right wrist Device: GE Logiq Q7 Ultrasound guided injection is preferred based studies that show increased duration, increased effect, greater accuracy, decreased procedural pain, increased response rate, and decreased cost with ultrasound guided versus blind injection.  Verbal informed consent obtained.  Time-out conducted.  Noted no overlying erythema, induration, or other signs of local infection.  Skin prepped in a sterile fashion.  Local anesthesia: Topical Ethyl chloride.  With sterile technique and under real time ultrasound guidance: With an 18-gauge 1 inch needle patient was injected with 0.5 cc of 0.5% Marcaine and gel-like material actually came out relatively easy.  Injected with 0.5 cc of Kenalog 40 mg/mL.  And then had even more gel-like material come out after having  expressed. Completed without difficulty  Pain immediately resolved suggesting accurate placement of the medication.  Advised to call if fevers/chills, erythema, induration, drainage, or persistent bleeding.  Images saved Impression: Technically successful ultrasound guided injection.   Impression and Recommendations:     The above documentation has been reviewed and is accurate and complete Karl Wilner M Yoshimi Sarr, DO

## 2024-07-06 DIAGNOSIS — K080 Exfoliation of teeth due to systemic causes: Secondary | ICD-10-CM | POA: Diagnosis not present

## 2024-07-14 ENCOUNTER — Encounter: Payer: Self-pay | Admitting: Gastroenterology

## 2024-07-18 DIAGNOSIS — K080 Exfoliation of teeth due to systemic causes: Secondary | ICD-10-CM | POA: Diagnosis not present

## 2024-07-27 ENCOUNTER — Other Ambulatory Visit: Payer: Self-pay | Admitting: Cardiovascular Disease

## 2024-07-28 MED ORDER — EZETIMIBE-SIMVASTATIN 10-40 MG PO TABS
1.0000 | ORAL_TABLET | Freq: Every day | ORAL | 3 refills | Status: AC
  Filled 2024-10-31: qty 90, 90d supply, fill #0

## 2024-08-15 ENCOUNTER — Ambulatory Visit

## 2024-08-15 VITALS — Ht 67.0 in | Wt 146.0 lb

## 2024-08-15 DIAGNOSIS — Z860100 Personal history of colon polyps, unspecified: Secondary | ICD-10-CM

## 2024-08-15 MED ORDER — NA SULFATE-K SULFATE-MG SULF 17.5-3.13-1.6 GM/177ML PO SOLN: 1.0000 | Freq: Once | ORAL | 0 refills | Status: AC

## 2024-08-15 NOTE — Progress Notes (Signed)

## 2024-08-22 ENCOUNTER — Encounter: Payer: Self-pay | Admitting: Gastroenterology

## 2024-08-31 ENCOUNTER — Encounter: Payer: Self-pay | Admitting: Gastroenterology

## 2024-08-31 ENCOUNTER — Ambulatory Visit: Admitting: Gastroenterology

## 2024-08-31 VITALS — BP 127/85 | HR 73 | Temp 97.5°F | Resp 12 | Ht 67.0 in | Wt 146.0 lb

## 2024-08-31 DIAGNOSIS — K635 Polyp of colon: Secondary | ICD-10-CM | POA: Diagnosis not present

## 2024-08-31 DIAGNOSIS — Z83719 Family history of colon polyps, unspecified: Secondary | ICD-10-CM

## 2024-08-31 DIAGNOSIS — Z1211 Encounter for screening for malignant neoplasm of colon: Secondary | ICD-10-CM

## 2024-08-31 DIAGNOSIS — Z860101 Personal history of adenomatous and serrated colon polyps: Secondary | ICD-10-CM | POA: Diagnosis not present

## 2024-08-31 DIAGNOSIS — Z860100 Personal history of colon polyps, unspecified: Secondary | ICD-10-CM

## 2024-08-31 DIAGNOSIS — D122 Benign neoplasm of ascending colon: Secondary | ICD-10-CM | POA: Diagnosis not present

## 2024-08-31 MED ORDER — SODIUM CHLORIDE 0.9 % IV SOLN: 500.0000 mL | INTRAVENOUS | Status: DC

## 2024-08-31 NOTE — Progress Notes (Signed)
 Called to room to assist during endoscopic procedure.  Patient ID and intended procedure confirmed with present staff. Received instructions for my participation in the procedure from the performing physician.

## 2024-08-31 NOTE — Op Note (Signed)
 [facility] Patient Name: Karl [name] Procedure Date: 08/31/2024 8:33 AM MRN: [id number] Endoscopist: Victory L. Legrand , MD, [id number] Age: 68 Referring MD:  Date of Birth: 11/19/1955 Gender: Male Account #: [id number] Procedure:                Colonoscopy Indications:              Colon cancer screening in patient at increased                            risk: Family history of 1st-degree relative                            (brother) with colon polyps,                           Personal history of colonic polyps - clinical                            details in April 2025 office note                           no polyps on last colonoscopy Nov 2020 Medicines:                Monitored Anesthesia Care Procedure:                Pre-Anesthesia Assessment:                           - Prior to the procedure, a History and Physical                            was performed, and patient medications and                            allergies were reviewed. The patient's tolerance of                            previous anesthesia was also reviewed. The risks                            and benefits of the procedure and the sedation                            options and risks were discussed with the patient.                            All questions were answered, and informed consent                            was obtained. Prior Anticoagulants: The patient has                            taken no anticoagulant or antiplatelet agents. ASA  Grade Assessment: II - A patient with mild systemic                            disease. After reviewing the risks and benefits,                            the patient was deemed in satisfactory condition to                            undergo the procedure.                           After obtaining informed consent, the colonoscope                            was passed under direct vision. Throughout the                             procedure, the patient's blood pressure, pulse, and                            oxygen saturations were monitored continuously. The                            CF [id number] #[id number] was introduced through the anus                            and advanced to the the cecum, identified by                            appendiceal orifice and ileocecal valve. The                            colonoscopy was performed without difficulty. The                            patient tolerated the procedure well. The quality                            of the bowel preparation was adequate with                            additional lavage of opaque liquid. The ileocecal                            valve, appendiceal orifice, and rectum were                            photographed. The bowel preparation used was SUPREP. Scope In: 8:41:59 AM Scope Out: 8:54:31 AM Scope Withdrawal Time: 0 hours 10 minutes 19 seconds  Total Procedure Duration: 0 hours 12 minutes 32 seconds  Findings:                 The perianal and digital rectal examinations were  normal.                           Repeat examination of right colon under NBI                            performed.                           Two sessile polyps were found in the ascending                            colon. The polyps were 5 mm in size. These polyps                            were removed with a cold snare. Resection and                            retrieval were complete.                           The exam was otherwise without abnormality on                            direct and retroflexion views. Complications:            No immediate complications. Estimated Blood Loss:     Estimated blood loss was minimal. Impression:               - Two 5 mm polyps in the ascending colon, removed                            with a cold snare. Resected and retrieved.                           - The examination was otherwise normal on direct                             and retroflexion views. Recommendation:           - Patient has a contact number available for                            emergencies. The signs and symptoms of potential                            delayed complications were discussed with the                            patient. Return to normal activities tomorrow.                            Written discharge instructions were provided to the                            patient.                           -  Resume previous diet.                           - Continue present medications.                           - Await pathology results.                           - Repeat colonoscopy is recommended for                            surveillance. The colonoscopy date will be                            determined after pathology results from today's                            exam become available for review. For next exam -                            consume more water with prep Victory L. Legrand, MD 08/31/2024 9:00:05 AM This report has been signed electronically.

## 2024-08-31 NOTE — Progress Notes (Signed)
 Report given to PACU, vss

## 2024-08-31 NOTE — Progress Notes (Signed)
 Pt's states no medical or surgical changes since previsit or office visit.

## 2024-08-31 NOTE — Progress Notes (Signed)
 History and Physical:  This patient presents for endoscopic testing for: Encounter Diagnoses  Name Primary?   History of colonic polyps Yes   Family history of colonic polyps     Hx adenomatous polyps outlined in April 2025 office note Family Hx colon polyps  Patient is otherwise without complaints or active issues today.   Past Medical History: Past Medical History:  Diagnosis Date   Allergy    Colon polyps    Hypercholesteremia    Prostate cancer (HCC) 04/2016     Past Surgical History: Past Surgical History:  Procedure Laterality Date   PROSTATECTOMY  04/2016   UPPER GASTROINTESTINAL ENDOSCOPY  12/24/2022    Allergies: Allergies  Allergen Reactions   Penicillins Rash    Outpatient Meds: Current Outpatient Medications  Medication Sig Dispense Refill   ezetimibe -simvastatin  (VYTORIN ) 10-40 MG tablet Take 1 tablet by mouth at bedtime. 90 tablet 3   Azelastine HCl 137 MCG/SPRAY SOLN Place 1 spray into both nostrils 2 (two) times daily. (Patient not taking: No sig reported)     colestipol  (COLESTID ) 1 g tablet Take 1 tablet (1 g total) by mouth 2 (two) times daily. 180 tablet 1   Cyanocobalamin (B-12) 1000 MCG CAPS      fluticasone (FLONASE) 50 MCG/ACT nasal spray Place into the nose as needed. (Patient not taking: No sig reported)     hydrocortisone 2.5 % cream SMARTSIG:Sparingly Topical Twice Daily PRN (Patient not taking: No sig reported)     levocetirizine (XYZAL) 5 MG tablet SMARTSIG:1 Tablet(s) By Mouth Every Evening (Patient not taking: No sig reported)     metoprolol  tartrate (LOPRESSOR ) 25 MG tablet Take 1 tablet by mouth up to twice daily only as needed (Patient not taking: Reported on 08/31/2024) 180 tablet 0   pantoprazole  (PROTONIX ) 40 MG tablet Take 1 tablet (40 mg total) by mouth daily. (Patient not taking: Reported on 08/15/2024) 90 tablet 0   Current Facility-Administered Medications  Medication Dose Route Frequency Provider Last Rate Last Admin   0.9  %  sodium chloride  infusion  500 mL Intravenous Continuous Danis, Victory CROME III, MD          ___________________________________________________________________ Objective   Exam:  BP (!) 162/90   Pulse 70   Temp (!) 97.5 F (36.4 C) (Temporal)   Resp 10   Ht 5' 7 (1.702 m)   Wt 146 lb (66.2 kg)   SpO2 100%   BMI 22.87 kg/m   CV: regular , S1/S2 Resp: clear to auscultation bilaterally, normal RR and effort noted GI: soft, no tenderness, with active bowel sounds.   Assessment: Encounter Diagnoses  Name Primary?   History of colonic polyps Yes   Family history of colonic polyps      Plan: Colonoscopy   The benefits and risks of the planned procedure(s) were described in detail with the patient or (when appropriate) their health care proxy.  Risks were outlined as including, but not limited to, bleeding, infection, perforation, adverse medication reaction leading to cardiac or pulmonary decompensation, pancreatitis (if ERCP).  The limitation of incomplete mucosal visualization was also discussed.  No guarantees or warranties were given.  The patient was provided an opportunity to ask questions and all were answered. The patient agreed with the plan.   The patient is appropriate for an endoscopic procedure in the ambulatory setting.   - Victory Brand, MD

## 2024-08-31 NOTE — Patient Instructions (Signed)
-  Handout on polyps provided -Await pathology results  YOU HAD AN ENDOSCOPIC PROCEDURE TODAY AT THE [facility]:   Refer to the procedure report that was given to you for any specific questions about what was found during the examination.  If the procedure report does not answer your questions, please call your gastroenterologist to clarify.  If you requested that your care partner not be given the details of your procedure findings, then the procedure report has been included in a sealed envelope for you to review at your convenience later.  YOU SHOULD EXPECT: Some feelings of bloating in the abdomen. Passage of more gas than usual.  Walking can help get rid of the air that was put into your GI tract during the procedure and reduce the bloating. If you had a lower endoscopy (such as a colonoscopy or flexible sigmoidoscopy) you may notice spotting of blood in your stool or on the toilet paper. If you underwent a bowel prep for your procedure, you may not have a normal bowel movement for a few days.  Please Note:  You might notice some irritation and congestion in your nose or some drainage.  This is from the oxygen used during your procedure.  There is no need for concern and it should clear up in a day or so.  SYMPTOMS TO REPORT IMMEDIATELY:  Following lower endoscopy (colonoscopy or flexible sigmoidoscopy):  Excessive amounts of blood in the stool  Significant tenderness or worsening of abdominal pains  Swelling of the abdomen that is new, acute  Fever of 100F or higher  For urgent or emergent issues, a gastroenterologist can be reached at any hour by calling [phone]. Do not use MyChart messaging for urgent concerns.    DIET:  We do recommend a small meal at first, but then you may proceed to your regular diet.  Drink plenty of fluids but you should avoid alcoholic beverages for 24 hours.  ACTIVITY:  You should plan to take it easy for the rest of today and you should NOT  DRIVE or use heavy machinery until tomorrow (because of the sedation medicines used during the test).    FOLLOW UP: Our staff will call the number listed on your records the next business day following your procedure.  We will call around 7:15- 8:00 am to check on you and address any questions or concerns that you may have regarding the information given to you following your procedure. If we do not reach you, we will leave a message.     If any biopsies were taken you will be contacted by phone or by letter within the next 1-3 weeks.  Please call us  at [phone] if you have not heard about the biopsies in 3 weeks.    SIGNATURES/CONFIDENTIALITY: You and/or your care partner have signed paperwork which will be entered into your electronic medical record.  These signatures attest to the fact that that the information above on your After Visit Summary has been reviewed and is understood.  Full responsibility of the confidentiality of this discharge information lies with you and/or your care-partner.

## 2024-09-01 ENCOUNTER — Telehealth: Payer: Self-pay | Admitting: Lactation Services

## 2024-09-01 NOTE — Telephone Encounter (Signed)
 No answer left voice mail

## 2024-09-02 LAB — SURGICAL PATHOLOGY

## 2024-09-04 ENCOUNTER — Ambulatory Visit: Payer: Self-pay | Admitting: Gastroenterology

## 2024-10-27 ENCOUNTER — Other Ambulatory Visit (HOSPITAL_BASED_OUTPATIENT_CLINIC_OR_DEPARTMENT_OTHER): Payer: Self-pay

## 2024-10-31 ENCOUNTER — Other Ambulatory Visit: Payer: Self-pay

## 2024-10-31 ENCOUNTER — Other Ambulatory Visit (HOSPITAL_BASED_OUTPATIENT_CLINIC_OR_DEPARTMENT_OTHER): Payer: Self-pay

## 2024-11-01 ENCOUNTER — Other Ambulatory Visit (HOSPITAL_BASED_OUTPATIENT_CLINIC_OR_DEPARTMENT_OTHER): Payer: Self-pay

## 2024-11-02 ENCOUNTER — Other Ambulatory Visit (HOSPITAL_BASED_OUTPATIENT_CLINIC_OR_DEPARTMENT_OTHER): Payer: Self-pay

## 2024-12-05 ENCOUNTER — Encounter: Admitting: Internal Medicine

## 2025-02-13 ENCOUNTER — Ambulatory Visit
